# Patient Record
Sex: Male | Born: 1950 | Race: White | Hispanic: No | Marital: Married | Smoking: Current every day smoker
Health system: Southern US, Community
[De-identification: ages and names within clinical notes are randomized; demographics above are authoritative.]

## PROBLEM LIST (undated history)

## (undated) DIAGNOSIS — E785 Hyperlipidemia, unspecified: Secondary | ICD-10-CM

## (undated) DIAGNOSIS — I252 Old myocardial infarction: Secondary | ICD-10-CM

## (undated) DIAGNOSIS — I10 Essential (primary) hypertension: Secondary | ICD-10-CM

## (undated) DIAGNOSIS — I255 Ischemic cardiomyopathy: Secondary | ICD-10-CM

## (undated) DIAGNOSIS — G4730 Sleep apnea, unspecified: Secondary | ICD-10-CM

## (undated) DIAGNOSIS — I219 Acute myocardial infarction, unspecified: Secondary | ICD-10-CM

## (undated) DIAGNOSIS — J449 Chronic obstructive pulmonary disease, unspecified: Secondary | ICD-10-CM

## (undated) DIAGNOSIS — N400 Enlarged prostate without lower urinary tract symptoms: Secondary | ICD-10-CM

## (undated) DIAGNOSIS — I493 Ventricular premature depolarization: Secondary | ICD-10-CM

## (undated) DIAGNOSIS — Z972 Presence of dental prosthetic device (complete) (partial): Secondary | ICD-10-CM

## (undated) HISTORY — DX: Old myocardial infarction: I25.2

## (undated) HISTORY — PX: SHOULDER SURGERY: SHX246

## (undated) HISTORY — DX: Ischemic cardiomyopathy: I25.5

## (undated) HISTORY — DX: Chronic obstructive pulmonary disease, unspecified: J44.9

## (undated) HISTORY — DX: Essential (primary) hypertension: I10

## (undated) HISTORY — PX: CYSTOSCOPY WITH INSERTION OF UROLIFT: SHX6678

## (undated) HISTORY — PX: HERNIA REPAIR: SHX51

## (undated) HISTORY — DX: Benign prostatic hyperplasia without lower urinary tract symptoms: N40.0

## (undated) HISTORY — DX: Hyperlipidemia, unspecified: E78.5

---

## 2006-11-29 DIAGNOSIS — I2510 Atherosclerotic heart disease of native coronary artery without angina pectoris: Secondary | ICD-10-CM

## 2006-11-29 HISTORY — DX: Atherosclerotic heart disease of native coronary artery without angina pectoris: I25.10

## 2007-09-18 ENCOUNTER — Ambulatory Visit: Payer: Self-pay | Admitting: *Deleted

## 2007-09-18 ENCOUNTER — Inpatient Hospital Stay (HOSPITAL_COMMUNITY): Admission: EM | Admit: 2007-09-18 | Discharge: 2007-09-20 | Payer: Self-pay | Admitting: Cardiology

## 2007-09-18 ENCOUNTER — Encounter: Payer: Self-pay | Admitting: Emergency Medicine

## 2007-10-09 ENCOUNTER — Ambulatory Visit: Payer: Self-pay | Admitting: Cardiology

## 2007-10-13 ENCOUNTER — Ambulatory Visit: Payer: Self-pay | Admitting: Cardiology

## 2010-09-23 ENCOUNTER — Emergency Department (HOSPITAL_COMMUNITY): Admission: EM | Admit: 2010-09-23 | Discharge: 2010-09-23 | Payer: Self-pay | Admitting: Emergency Medicine

## 2011-04-13 NOTE — Discharge Summary (Signed)
NAMETEMITOPE, GRIFFING               ACCOUNT NO.:  [id number]   MEDICAL RECORD NO.:  [id number]          PATIENT TYPE:  INP   LOCATION:  2919                         FACILITY:  [facility]   PHYSICIAN:  Everardo Beals. Juanda Chance, MD, FACCDATE OF BIRTH:  March 18, 1951   DATE OF ADMISSION:  09/18/2007  DATE OF DISCHARGE:                         DISCHARGE SUMMARY - REFERRING   ADDENDUM:  Please send copy of the original discharge summary to Dr.  Olena Leatherwood.  Thanks.      Joellyn Rued, PA-C      Bruce R. Juanda Chance, MD, [facility]  Electronically Signed    EW/MEDQ  D:  09/20/2007  T:  09/20/2007  Job:  [id number]

## 2011-04-13 NOTE — H&P (Signed)
NAMEFERD, Scott Castaneda               ACCOUNT NO.:  [id number]   MEDICAL RECORD NO.:  [id number]          PATIENT TYPE:  INP   LOCATION:  2901                         FACILITY:  [facility]   PHYSICIAN:  Audery Amel, MD    DATE OF BIRTH:  Dec 18, 1950   DATE OF ADMISSION:  09/18/2007  DATE OF DISCHARGE:                              HISTORY & PHYSICAL   PRIMARY CARE PHYSICIAN:  Dr. Robynn Pane.   CHIEF COMPLAINT:  Chest pain.   HISTORY OF PRESENT ILLNESS:  The patient is a 60 year old white male  with no significant past medical history, who presented to [facility]  emergency department after the onset of chest pain early this evening.  The patient states that he was experiencing indigestion symptoms, which  began at approximately 2:00 p.m. yesterday.  The patient thought it  might be a viral gastrointestinal illness, as one of his  coworkers  recently had similar symptoms.  His symptoms persisted throughout the  evening hours yesterday.  He does also note that he had some discomfort  in his throat; however denies any chest pain.  On awakening this  morning, his symptoms of indigestion had resolved; however, he continued  to feel very fatigued and did not go to work today.  This evening after  eating dinner, the patient had a presyncopal episode associated with  profound diaphoresis.  The patient was taken to [facility] for  evaluation, and his initial EKG revealed inferior ST elevation.  The  patient was transferred to [facility] and a Code status was activated.  The patient was taken emergently to the catheterization laboratory,  where he was found to have a distal occlusion in his right coronary  artery and a significant amount of thrombus.  The patient remained  hemodynamically stable throughout the case, and underwent percutaneous  coronary intervention with a Promus [id number] stent.  The patient had an  excellent angiographic result, with resolution of his symptoms.  Currently he is chest  pain-free and otherwise without complaints.   REVIEW OF SYSTEMS:  The patient denies any shortness of breath, dyspnea  on exertion, PND, orthopnea or palpitations.  The remainder of his  review of systems are all negative.   PAST MEDICAL HISTORY:  Gastroesophageal reflux disease.   ALLERGIES:  NO KNOWN DRUG ALLERGIES.   CURRENT MEDICATIONS:  The patient takes an over-the-counter H2  antagonist.   SOCIAL HISTORY:  The patient lives in [place] with his wife.  He is  self-employed as a Emergency planning/management officer.  He smokes 2 packs of  cigarettes daily, and has done so for approximately 34 years.  He  endorses occasional alcohol use, however denies any illicit substance.   FAMILY HISTORY:  His father died from coronary artery disease at the age  of 4.  His mother died from lung cancer at the age of 104.  He has a  brother who has hypertension and a sister with no significant medical  problems.   PHYSICAL EXAMINATION:  Blood pressure 129/79, heart rate 72, O2  saturations 95% on room air, weight 74.8 kg.  GENERAL:  Alert  and oriented x3 in no acute stress.  Pleasantly  conversant.  HEENT:  Normocephalic, atraumatic, EOMI, PERRL, nares  patent, oropharynx is clear without erythema or exudate.  NECK:  Supple, full range of motion and no appreciable JVD.  Carotid  upstrokes were equal and symmetric bilaterally.  Lymphadenopathy none.  CARDIOVASCULAR:  Normal S1 and S2, with no audible murmurs, rubs or  gallops.  His PMI is not displaced in the left mid-clavicular line.  Peripheral pulses are 2+ and symmetric bilaterally.  LUNGS: Clear to auscultation bilaterally.  SKIN:  Reveals no rash or lesions.  ABDOMEN:  Soft, nontender, nondistended; positive bowel sounds.  No  hepatosplenomegaly.  GU: Normal male genitalia.  EXTREMITIES: No clubbing, cyanosis or edema.  The catheterization site  in his right groin is clean, dry and intact.  There were  no audible  bruits.  MUSCULOSKELETAL:  No  joint deformity or effusions.  NEUROLOGIC:  Grossly nonfocal.  His strength and sensation are grossly  intact throughout.   CHEST X-RAY:  No acute cardiopulmonary process.   EKG:  The presenting EKG revealed normal sinus rhythm with ST elevation  in the inferior leads and ST depression in V1-V2 -- consistent with an  inferoposterior ST elevation MI.   LABS:  White count 15.9, hematocrit 45.5, platelet count 223.  Sodium  136, potassium 3.7, chloride 106, CO2 25, BUN 7. creatinine 0.9, glucose  122, myoglobin 387, CK-MB greater than 80 by point-of-care markers.  Troponin by point-of-care 28.7.   IMPRESSION:  1. Acute Coronary Syndrome:  Inferoposterior ST elevation myocardial      infarction.  2. Gastroesophageal Reflux Disease.   PLAN:  The patient was taken emergently and underwent a percutaneous  coronary intervention with the 2.5 x 15 Promus stent.  The stent was  post-dilated.  The patient achieved an excellent angiographic result,  with resolution of his symptoms.  The patient was initiated on  bivalirudin for anticoagulation for the case, and we will continue with  this at 0.25 mg/kg to complete his infusion.  We will continue aspirin  325 mg once daily.  The patient was loaded with 600 mg of Plavix in the  field.  We will continue 75 mg of Plavix p.o. daily.  We will check his  fasting lipid profile in the a.m., and start Lupitor 80 mg once daily.   The patient is currently in a junctional rhythm, but asymptomatic.  We  will continue normal saline at 150 mL/hr x 12 hr.  We withhold beta  blocker therapy at this time.  We will continue normal saline at 150  mL/hr x12 hr.  We will monitor the patient in the CCU overnight, and if  he remains chest pain-free and hemodynamically stable will probably  transfer to the Floor in the morning.      Audery Amel, MD  Electronically Signed     SHG/MEDQ  D:  09/18/2007  T:  09/19/2007  Job:  [id number]

## 2011-04-13 NOTE — Cardiovascular Report (Signed)
Scott, HIRSCH               ACCOUNT NO.:  [id number]   MEDICAL RECORD NO.:  [id number]          PATIENT TYPE:  INP   LOCATION:  2901                         FACILITY:  [facility]   PHYSICIAN:  Everardo Beals. Juanda Chance, MD, FACCDATE OF BIRTH:  12/17/1950   DATE OF PROCEDURE:  09/19/2007  DATE OF DISCHARGE:                            CARDIAC CATHETERIZATION   CLINICAL HISTORY:  Mr. Genest is 60 years old, and has had no prior  history of known heart disease.  He is a smoker, and his father had a  heart attack.  Yesterday, he developed some indigestion and epigastric  pain which persisted throughout much of the day and evening.  He felt  better,  today, but this evening about 7 o'clock he had a syncopal  episode at home.  He was brought to [facility] Emergency Room by EMS  where his ECG showed changes of an acute diaphragmatic wall infarction.  He was having minimal pain but code STEMI was called, and his  transferred to [place] by [facility] for evaluation with angiography and for  possible intervention.   PROCEDURE:  The procedure was performed via right femoral artery using  arterial sheath and 6-French preformed coronary catheters.  After  completion of the diagnostic study, I made a decision to proceed with  intervention on the totally occluded distal right coronary.  A JR-4, 6-  [place] guiding catheter with side holes was used.  The patient had been  given 600 mg of Plavix and 4 chewable aspirin in the field, and 3000  units of heparin in the field, and received bivalirudin bolus and  infusion here.   We used a Research scientist (physical sciences) and crossed the lesion without too much  difficulty.  This did not reestablish flow.  We then went in with a 2.25  x 20-mm Maverick and dilated the lesion, and this did establish TIMI 2  flow.  We decided to go ahead and inflate the balloon, and we went up to  6 atmospheres for 20 seconds and this improved the flow.  We then went  down with a fetch aspiration catheter and  performed 3 runs.  We obtained  minimal thrombus.   We then dilated the lesion once more with a 2.25 x 20 mm balloon with  one inflation up to 10 atmospheres for 20 seconds.  We then deployed a  2.5 x 15 mm Promus stent.  We deployed this extending from the distal  right coronary into the posterior branch, and crossing a posterolateral  branch.  We deployed this with one inflation of 14 atmospheres for 20  seconds.  We postdilated the distal part of the stent with a 2.75 x 12  mm Quantum Maverick up to 16 atmospheres, and we postdilated the  proximal edge of the stent with a 3.25 x 8 mm Quantum Maverick up to 16  atmospheres.  Final diagnostic study was then performed through the  guiding catheter.  The right femoral artery was closed  with Angio-Seal  at the end of the procedure.   RESULTS:  The left ventricular pressure was 110/17, and the  aortic  pressure was 110/64 with a mean of 84.   LEFT MAIN CORONARY ARTERY:  The left main coronary artery was free of  significant disease.   LEFT ANTERIOR DESCENDING ARTERY:  The left anterior descending artery  gave rise to a large septal perforator and three diagonal branches.  The  LAD terminated before the apex.  There was 40% segmental narrowing in  the proximal vessel.   CIRCUMFLEX ARTERY:  The circumflex artery was a moderate-sized vessel  that gave rise to 2 marginal branches, 2 atrial branches, and a small-  and-large posterolateral branch.  There was 30% proximal, 60% mid, and  40% distal stenosis in the circumflex artery.   RIGHT CORONARY ARTERY:  The right coronary artery was a moderately large  vessel that gave rise to two right ventricular branches and then was  completely occluded before the posterior descending branch.  The distal  right coronary artery filled faintly from the left coronary injection.   LEFT VENTRICULOGRAM:  The left ventriculogram performed in the RAO  projection showed akinesis of the inferior wall out to  the tip of the  apex.  The anterolateral wall moved well.  The estimated ejection  fraction was 45-50%.   Following stenting of the lesion to the distal right coronary artery the  stenosis went from 100% to 0%, and the flow improved from TIMI 0 to TIMI  3 flow with __________  blush grade 3.   CONCLUSION:  1. Acute diaphragmatic wall myocardial infarction with total occlusion      of the distal right coronary artery, 60% mid and 40% distal      stenosis in the circumflex artery, 40% narrowing of the proximal      LAD, and inferior wall akinesis with an estimated ejection fraction      of 45%-50%  2. Successful reperfusion aspiration thrombectomy and stenting of the      lesion in the distal right coronary with improvement in the      sentinel narrowing from 100% to 0% and improvement in the flow from      TIMI 0 to TIMI 3 flow.   DISPOSITION:  The patient was returned to his room for further  observation.  Will target discharge on day #2 if patient remains stable.      Bruce Elvera Lennox Juanda Chance, MD, [facility]  Electronically Signed     BRB/MEDQ  D:  09/18/2007  T:  09/19/2007  Job:  [id number]   cc:   Lia Hopping  Cardiopulmonary Lab

## 2011-04-13 NOTE — Discharge Summary (Signed)
NAMENICKY, KRAS               ACCOUNT NO.:  [id number]   MEDICAL RECORD NO.:  [id number]          PATIENT TYPE:  INP   LOCATION:  2919                         FACILITY:  [facility]   PHYSICIAN:  Everardo Beals. Juanda Chance, MD, FACCDATE OF BIRTH:  1951-10-19   DATE OF ADMISSION:  09/06/2007  DATE OF DISCHARGE:                         DISCHARGE SUMMARY - REFERRING   DISCHARGE DIAGNOSES:  1. Acute inferior myocardial infarction, presented with abdominal      discomfort and syncope.  2. Coronary artery disease status post stenting of the distal RCA.  3. Hyperlipidemia.  4. Tobacco use.  5. Initially sinus bradycardia.  6. Elevated liver function tests.   PROCEDURES PERFORMED:  Cardiac catheterization on September 18, 2007 by  Dr. Juanda Chance with primus stenting of the distal RCA.   BRIEF HISTORY:  Mr. Moone is a 60 year old white male who was moving  yesterday and had a sudden onset of abdominal discomfort associated with  shortness of breath, diaphoresis.  The discomfort lasted at least 24  hours, but on the day of admission, he had an episode of syncope at  rest.  Bystanders noted cyanotic lips.  He did not have any seizure  activity, thus his presentation.   PAST MEDICAL HISTORY:  1. No known drug allergies.  2. GERD and over-the-counter medication.  3. Tobacco use.   LABORATORY:  Chest x-ray on the October 20, showed minimal right basilar  atelectasis.  H&H 15.6 and 45.5, normal indices, platelets 223, WBCs  15.9.  prior to discharge on October 21 H&H was 14.7 and 41.8, normal  indices, platelets 200, WBC 13.0.  Admission sodium was 136, potassium  3.7, BUN 7, creatinine 0.9, glucose 122.  AST was 242 and ALT at 55.  On  October 21, BUN and creatinine were 6 and 0.92, potassium 3.9, glucose  103.  CK-MB at [facility] was 23, 96 and 277.6 with a relative index  11.6 and troponin 71.99.  fasting lipids showed a total cholesterol 181,  triglycerides 115, HDL 31, LDL 127.  Alcohol less than  five.  Echocardiogram was performed yesterday and is pending at the time of  this dictation.  EKGs showed normal sinus rhythm, left axis deviation,  LVH, ST elevation inferolaterally on admission.  This admission shows  evolving inferior myocardial infarction with Q-waves inferiorly and T-  wave inversion.   HOSPITAL COURSE:  The patient was transferred to [facility]  with acute MI.  On September 18, 2007, Dr. Juanda Chance performed cardiac  catheterization.  This revealed a 40% LAD, 30% proximal circ, 60% mid  circ, 40% distal circ.  Irregularities in the proximal mid RCA 30%  distal RCA, 100% RCA just prior to the PDA and PL.  Dr. Juanda Chance performed  primus stenting reducing the lesion from 100% to 0%.  He was admitted to  CCU post procedure.  Catheterization site was intact.  The night he did  rule in for myocardial infarction, he did not appear to have any  evidence of heart failure.  ACE inhibitor was not added.  However, given  his bradycardia, beta blocker was held.  Telemetry  did show some  nonsustained ventricular tachycardia.  Research placed him in the adept  DES study.  Tobacco cessation consult was performed.  Cardiac rehab also  assisted with ambulation and education.  By October 22, the patient was  doing much better.  Dr. Juanda Chance felt that the patient could be discharged  home.  Dr. Juanda Chance suggested follow up in [facility] with Dr. Andee Lineman as well as  cardiac rehab.  However, there was concern that he may not be able to  afford rehab as he has no health insurance.  Case management was  assisting with Plavix forms prior to discharge.  Given his low blood  pressure it was decided no ACE inhibitor.  However, slight dose of beta  blocker will be begun.   DISPOSITION:  The patient is discharged home.   DISCHARGE INSTRUCTIONS:  1. Activities are restricted for 2 weeks in regards to lifting,      driving, sexual activity or heavy exertion.  He was told not to go      back to work  until seen by the physician.  2 . Wound care as per supplemental sheet.  1. New prescriptions include aspirin 325 mg daily, Plavix 75 mg daily,      simvastatin 40 mg q.h.s., nitroglycerin 0.4 as needed, Toprol XL 25      mg as needed.  2. He is advised no smoking or tobacco products.  3. He needs to obtain a primary care physician.  4. He will need blood work in approximately 6-8 weeks in regards to      FLP and LFTs.  Consideration may be given to checking these sooner      as his AST and ALT were elevated during this admission.  He was      asked to bring all medications to all appointments.  He will follow      up with Dr. Andee Lineman in [place] on October 09, 2007 at 12:15.  He was      advised to bring all medications to all appointments.      Joellyn Rued, PA-C      Bruce R. Juanda Chance, MD, [facility]  Electronically Signed    EW/MEDQ  D:  09/20/2007  T:  09/20/2007  Job:  [id number]   cc:   Learta Codding, MD,FACC

## 2011-04-13 NOTE — Assessment & Plan Note (Signed)
[facility]                          [facility] OFFICE NOTE   Castaneda, Scott Castaneda                        MRN:          [id number]  DATE:10/09/2007                            DOB:          [birth year]    REFERRING PHYSICIAN:  Lia Hopping   HISTORY OF PRESENT ILLNESS:  The patient is a 60 year old male with no  prior history of coronary artery disease but heavy tobacco use. The  patient used to smoke up to two packs a day. He has now cut down to two  cigarettes a day. The patient on September 19, 2007, was brought to [facility] with an acute inferior wall myocardial infarction.  Interestingly, however, the patient symptoms had occurred several days  prior to admission. Also, the patient subsequently underwent cardiac  catheterization and was found to have an occluded right coronary artery  and was stented with a PROMUS stent. Ejection fraction was 45-50%. There  was inferior akinesis. EKG today in the office denotes large Q-waves and  inferior walls and inverted T-waves consistent with late presentation of  inferior wall myocardial infarction. The patient however is feeling  better. He has no chest pain, shortness of breath, orthopnea, PND. He  has cut back on his tobacco use as outlined above. He reports no  palpitations or syncope.   The patient is compliant with his medical regimen.   CURRENT MEDICATIONS:  1. Plavix 75 mg p.o. daily.  2. Simvastatin 40 mg p.o. nightly.  3. Metoprolol 25 mg p.o. daily.  4. Aspirin 325 mg p.o. daily.   PHYSICAL EXAMINATION:  VITAL SIGNS: Blood pressure is 130/56, heart rate  57 beats per minute, weight 165 pounds.  NECK: Normal carotid upstroke. No carotid bruits.  LUNGS:  Clear breath sounds bilaterally.  HEART: Regular rate and rhythm. Normal S1, S2. No murmur, rubs or  gallops.  ABDOMEN: Soft and nontender. No rebound or guarding. Good bowel sounds.  EXTREMITIES: No cyanosis, clubbing or  edema.   PROBLEM LIST:  1. Status post inferior wall myocardial infarction with late      presentation.  2. Q-waves inferior leads and persistent T-wave inversion.  3. Ejection fraction of 45-50%.  4. Dyslipidemia with low HDL and LDL of 127.  5. Elevated liver function test.  6. Tobacco use.   PLAN:  1. The patient will have an echocardiogram study to reassess his      ejection fraction.  2. The patient will have a GXT done in 4-6 weeks to assess his      exercise tolerance.  3. Liver function tests and lipid panel will be drawn at the time of      his echocardiographic study.   I have advised the patient to continue on his current medical therapy  with close monitoring his cholesterol panel. The patient will see [place]  back in six months.     Learta Codding, MD,FACC  Electronically Signed    GED/MedQ  DD: 10/09/2007  DT: 10/09/2007  Job #: [id number]   cc:   Lia Hopping

## 2011-09-08 LAB — CBC
HCT: 41.8
HCT: 45.5
Hemoglobin: 14.7
Hemoglobin: 15.6
MCHC: 34.4
MCV: 95.1
Platelets: 223
RBC: 4.45
RBC: 4.79
RDW: 12.6
WBC: 13 — ABNORMAL HIGH
WBC: 15.9 — ABNORMAL HIGH

## 2011-09-08 LAB — POCT CARDIAC MARKERS
CKMB, poc: >80
Myoglobin, poc: 387
Operator id: 208401
Troponin i, poc: 28.7

## 2011-09-08 LAB — DIFFERENTIAL
Basophils Absolute: 0
Basophils Relative: 0
Eosinophils Absolute: 0.1
Eosinophils Relative: 0
Lymphocytes Relative: 11 — ABNORMAL LOW
Lymphs Abs: 1.7
Monocytes Absolute: 1.4 — ABNORMAL HIGH
Monocytes Relative: 9
Neutro Abs: 12.7 — ABNORMAL HIGH
Neutrophils Relative %: 80 — ABNORMAL HIGH

## 2011-09-08 LAB — COMPREHENSIVE METABOLIC PANEL WITH GFR
ALT: 55 — ABNORMAL HIGH
Albumin: 3.6
Alkaline Phosphatase: 71
Calcium: 9
Glucose, Bld: 122 — ABNORMAL HIGH
Potassium: 3.7
Sodium: 136
Total Protein: 6

## 2011-09-08 LAB — COMPREHENSIVE METABOLIC PANEL
AST: 242 — ABNORMAL HIGH
BUN: 7
CO2: 25
Chloride: 106
Creatinine, Ser: 0.9
GFR calc Af Amer: >60
GFR calc non Af Amer: >60
Total Bilirubin: 0.6

## 2011-09-08 LAB — BASIC METABOLIC PANEL
BUN: 6
CO2: 23
Chloride: 103
GFR calc non Af Amer: >60
Glucose, Bld: 103 — ABNORMAL HIGH
Potassium: 3.9

## 2011-09-08 LAB — LIPID PANEL
HDL: 31 — ABNORMAL LOW
LDL Cholesterol: 127 — ABNORMAL HIGH
Triglycerides: 115
VLDL: 23

## 2011-09-08 LAB — ETHANOL: Alcohol, Ethyl (B): <5

## 2011-09-08 LAB — CARDIAC PANEL(CRET KIN+CKTOT+MB+TROPI): Relative Index: 11.6 — ABNORMAL HIGH

## 2019-10-22 ENCOUNTER — Telehealth: Payer: Self-pay | Admitting: Acute Care

## 2019-10-22 DIAGNOSIS — Z122 Encounter for screening for malignant neoplasm of respiratory organs: Secondary | ICD-10-CM

## 2019-10-22 DIAGNOSIS — Z87891 Personal history of nicotine dependence: Secondary | ICD-10-CM

## 2019-10-22 DIAGNOSIS — F17210 Nicotine dependence, cigarettes, uncomplicated: Secondary | ICD-10-CM

## 2019-10-23 NOTE — Telephone Encounter (Signed)
Pt returning missed call and would like a call back 

## 2019-10-23 NOTE — Telephone Encounter (Signed)
LMTC x 1  

## 2019-10-29 NOTE — Telephone Encounter (Signed)
Spoke with pt and scheduled SDMV 11/12/19 3:30 CT ordered Nothing further needed

## 2019-11-12 ENCOUNTER — Other Ambulatory Visit: Payer: Self-pay

## 2019-11-12 ENCOUNTER — Ambulatory Visit (INDEPENDENT_AMBULATORY_CARE_PROVIDER_SITE_OTHER): Payer: Commercial Managed Care - PPO | Admitting: Acute Care

## 2019-11-12 ENCOUNTER — Ambulatory Visit
Admission: RE | Admit: 2019-11-12 | Discharge: 2019-11-12 | Disposition: A | Discharge status: Home / Self Care | Payer: Commercial Managed Care - PPO | Source: Ambulatory Visit | Attending: Acute Care | Admitting: Acute Care

## 2019-11-12 ENCOUNTER — Encounter: Payer: Self-pay | Admitting: Acute Care

## 2019-11-12 VITALS — BP 102/62 | HR 62 | Temp 98.2°F | Ht 66.0 in | Wt 169.0 lb

## 2019-11-12 DIAGNOSIS — F17210 Nicotine dependence, cigarettes, uncomplicated: Secondary | ICD-10-CM

## 2019-11-12 DIAGNOSIS — Z87891 Personal history of nicotine dependence: Secondary | ICD-10-CM

## 2019-11-12 DIAGNOSIS — Z122 Encounter for screening for malignant neoplasm of respiratory organs: Secondary | ICD-10-CM

## 2019-11-12 NOTE — Patient Instructions (Signed)
Thank you for participating in the [place] Lung Cancer Screening Program. It was our pleasure to meet you today. We will call you with the results of your scan within the next few days. Your scan will be assigned a Lung RADS category score by the physicians reading the scans.  This Lung RADS score determines follow up scanning.  See below for description of categories, and follow up screening recommendations. We will be in touch to schedule your follow up screening annually or based on recommendations of our providers. We will fax a copy of your scan results to your Primary Care Physician, or the physician who referred you to the program, to ensure they have the results. Please call the office if you have any questions or concerns regarding your scanning experience or results.  Our office number is [phone]. Please speak with Denise Phelps, RN. She is our Lung Cancer Screening RN. If she is unavailable when you call, please have the office staff send her a message. She will return your call at her earliest convenience. Remember, if your scan is normal, we will scan you annually as long as you continue to meet the criteria for the program. (Age 55-77, Current smoker or smoker who has quit within the last 15 years). If you are a smoker, remember, quitting is the single most powerful action that you can take to decrease your risk of lung cancer and other pulmonary, breathing related problems. We know quitting is hard, and we are here to help.  Please let us know if there is anything we can do to help you meet your goal of quitting. If you are a former smoker, congratulations. We are proud of you! Remain smoke free! Remember you can refer friends or family members through the number above.  We will screen them to make sure they meet criteria for the program. Thank you for helping us take better care of you by participating in Lung Screening.  Lung RADS Categories:  Lung RADS 1: no nodules  or definitely non-concerning nodules.  Recommendation is for a repeat annual scan in 12 months.  Lung RADS 2:  nodules that are non-concerning in appearance and behavior with a very low likelihood of becoming an active cancer. Recommendation is for a repeat annual scan in 12 months.  Lung RADS 3: nodules that are probably non-concerning , includes nodules with a low likelihood of becoming an active cancer.  Recommendation is for a 6-month repeat screening scan. Often noted after an upper respiratory illness. We will be in touch to make sure you have no questions, and to schedule your 6-month scan.  Lung RADS 4 A: nodules with concerning findings, recommendation is most often for a follow up scan in 3 months or additional testing based on our provider's assessment of the scan. We will be in touch to make sure you have no questions and to schedule the recommended 3 month follow up scan.  Lung RADS 4 B:  indicates findings that are concerning. We will be in touch with you to schedule additional diagnostic testing based on our provider's  assessment of the scan.   

## 2019-11-12 NOTE — Progress Notes (Signed)
Shared Decision Making Visit Lung Cancer Screening Program [phone])   Eligibility:  Age 68 y.o.  Pack Years Smoking History Calculation 46 pack year smoking history (# packs/per year x # years smoked)  Recent History of coughing up blood  no  Unexplained weight loss? no ( >Than 15 pounds within the last 6 months )  Prior History Lung / other cancer no (Diagnosis within the last 5 years already requiring surveillance chest CT Scans).  Smoking Status Current Smoker  Former Smokers: Years since quit: NA  Quit Date: 11/12/2019  Visit Components:  Discussion included one or more decision making aids. yes  Discussion included risk/benefits of screening. yes  Discussion included potential follow up diagnostic testing for abnormal scans. yes  Discussion included meaning and risk of over diagnosis. yes  Discussion included meaning and risk of False Positives. yes  Discussion included meaning of total radiation exposure. yes  Counseling Included:  Importance of adherence to annual lung cancer LDCT screening. yes  Impact of comorbidities on ability to participate in the program. yes  Ability and willingness to under diagnostic treatment. yes  Smoking Cessation Counseling:  Current Smokers:   Discussed importance of smoking cessation. yes  Information about tobacco cessation classes and interventions provided to patient. yes  Patient provided with "ticket" for LDCT Scan. yes  Symptomatic Patient. no  Counseling  Diagnosis Code: Tobacco Use Z72.0  Asymptomatic Patient yes  Counseling (Intermediate counseling: > three minutes counseling) L9379  Former Smokers:   Discussed the importance of maintaining cigarette abstinence. yes  Diagnosis Code: Personal History of Nicotine Dependence. K24.097  Information about tobacco cessation classes and interventions provided to patient. Yes  Patient provided with "ticket" for LDCT Scan. yes  Written Order for Lung Cancer  Screening with LDCT placed in Epic. Yes (CT Chest Lung Cancer Screening Low Dose W/O CM) [id number] Z12.2-Screening of respiratory organs Z87.891-Personal history of nicotine dependence  BP 102/62 (BP Location: Left Arm, Cuff Size: Normal)   Pulse 62   Temp 98.2 F (36.8 C) (Oral)   Ht 5\' 6"  (1.676 m)   Wt 169 lb (76.7 kg)   SpO2 96%   BMI 27.28 kg/m    I have spent 25 minutes of face to face time with Scott Castaneda discussing the risks and benefits of lung cancer screening. We viewed a power point together that explained in detail the above noted topics. We paused at intervals to allow for questions to be asked and answered to ensure understanding.We discussed that the single most powerful action that he can take to decrease his risk of developing lung cancer is to quit smoking. We discussed whether or not he is ready to commit to setting a quit date. We discussed options for tools to aid in quitting smoking including nicotine replacement therapy, non-nicotine medications, support groups, Quit Smart classes, and behavior modification. We discussed that often times setting smaller, more achievable goals, such as eliminating 1 cigarette a day for a week and then 2 cigarettes a day for a week can be helpful in slowly decreasing the number of cigarettes smoked. This allows for a sense of accomplishment as well as providing a clinical benefit. I gave her the " Be Stronger Than Your Excuses" card with contact information for community resources, classes, free nicotine replacement therapy, and access to mobile apps, text messaging, and on-line smoking cessation help. I have also given him  my card and contact information in the event he needs to contact me. We discussed the time  and location of the scan, and that either Scott Miyamoto RN or I will call with the results within 24-48 hours of receiving them. I have offered her  a copy of the power point we viewed  as a resource in the event they need reinforcement  of the concepts we discussed today in the office. The patient verbalized understanding of all of  the above and had no further questions upon leaving the office. They have my contact information in the event they have any further questions.  I spent 4 minutes counseling on smoking cessation and the health risks of continued tobacco abuse.  I explained to the patient that there has been a high incidence of coronary artery disease noted on these exams. I explained that this is a non-gated exam therefore degree or severity cannot be determined. This patient is on statin therapy. I have asked the patient to follow-up with their PCP regarding any incidental finding of coronary artery disease and management with diet or medication as their PCP  feels is clinically indicated. The patient verbalized understanding of the above and had no further questions upon completion of the visit.      Bevelyn Ngo, NP 11/12/2019 4:02 PM

## 2019-11-15 ENCOUNTER — Other Ambulatory Visit: Payer: Self-pay | Admitting: *Deleted

## 2019-11-15 DIAGNOSIS — Z87891 Personal history of nicotine dependence: Secondary | ICD-10-CM

## 2019-11-15 DIAGNOSIS — F17210 Nicotine dependence, cigarettes, uncomplicated: Secondary | ICD-10-CM

## 2020-11-12 ENCOUNTER — Other Ambulatory Visit: Payer: Self-pay

## 2020-11-12 ENCOUNTER — Ambulatory Visit (HOSPITAL_COMMUNITY)
Admission: RE | Admit: 2020-11-12 | Discharge: 2020-11-12 | Disposition: A | Discharge status: Home / Self Care | Payer: Commercial Managed Care - PPO | Source: Ambulatory Visit | Attending: Acute Care | Admitting: Acute Care

## 2020-11-12 DIAGNOSIS — Z87891 Personal history of nicotine dependence: Secondary | ICD-10-CM | POA: Diagnosis present

## 2020-11-12 DIAGNOSIS — F17210 Nicotine dependence, cigarettes, uncomplicated: Secondary | ICD-10-CM | POA: Diagnosis present

## 2020-11-24 ENCOUNTER — Telehealth: Payer: Self-pay | Admitting: Acute Care

## 2020-11-24 DIAGNOSIS — Z87891 Personal history of nicotine dependence: Secondary | ICD-10-CM

## 2020-11-24 DIAGNOSIS — F17210 Nicotine dependence, cigarettes, uncomplicated: Secondary | ICD-10-CM

## 2020-11-24 NOTE — Telephone Encounter (Signed)
Pt informed of CT results per Sarah Groce, NP.  PT verbalized understanding.  Copy sent to PCP.  Order placed for 1 yr f/u CT.  

## 2020-11-27 NOTE — Progress Notes (Signed)
Please call patient and let them  know their  low dose Ct was read as a Lung RADS 2: nodules that are benign in appearance and behavior with a very low likelihood of becoming a clinically active cancer due to size or lack of growth. Recommendation per radiology is for a repeat LDCT in 12 months.Please let them  know we will order and schedule their  annual screening scan for 10/2021. Please let them  know there was notation of CAD on their  scan.  Please remind the patient  that this is a non-gated exam therefore degree or severity of disease  cannot be determined. Please have them  follow up with their PCP regarding potential risk factor modification, dietary therapy or pharmacologic therapy if clinically indicated. Pt.  is  currently on statin therapy. Please place order for annual  screening scan for  10/2021 and fax results to PCP. Thanks so much.  Please let him know there was notation of CAD on his scan. He is followed by cards. Thanks so much

## 2021-02-24 ENCOUNTER — Ambulatory Visit: Payer: Commercial Managed Care - PPO | Admitting: Cardiology

## 2021-04-03 ENCOUNTER — Encounter: Payer: Self-pay | Admitting: *Deleted

## 2021-04-06 ENCOUNTER — Encounter: Payer: Self-pay | Admitting: *Deleted

## 2021-04-06 ENCOUNTER — Telehealth: Payer: Self-pay | Admitting: Cardiology

## 2021-04-06 ENCOUNTER — Ambulatory Visit (INDEPENDENT_AMBULATORY_CARE_PROVIDER_SITE_OTHER): Payer: Commercial Managed Care - PPO | Admitting: Cardiology

## 2021-04-06 ENCOUNTER — Encounter: Payer: Self-pay | Admitting: Cardiology

## 2021-04-06 VITALS — BP 126/78 | HR 56 | Ht 66.0 in | Wt 168.6 lb

## 2021-04-06 DIAGNOSIS — I2510 Atherosclerotic heart disease of native coronary artery without angina pectoris: Secondary | ICD-10-CM | POA: Diagnosis not present

## 2021-04-06 DIAGNOSIS — I252 Old myocardial infarction: Secondary | ICD-10-CM

## 2021-04-06 DIAGNOSIS — R0602 Shortness of breath: Secondary | ICD-10-CM | POA: Diagnosis not present

## 2021-04-06 DIAGNOSIS — E782 Mixed hyperlipidemia: Secondary | ICD-10-CM

## 2021-04-06 NOTE — Patient Instructions (Signed)
Your physician recommends that you schedule a follow-up appointment in: 3 MONTHS WITH EXTENDER  Your physician recommends that you continue on your current medications as directed. Please refer to the Current Medication list given to you today.  Your physician has requested that you have en exercise stress myoview. For further information please visit [email or web address] Please follow instruction sheet, as given.  Thank you for choosing [facility]!!

## 2021-04-06 NOTE — Progress Notes (Signed)
Clinical Summary Scott Castaneda is a 70 y.o.male seen today as a new consult, referred by Dr Olena Leatherwood for the following meidcla problems.    1. CAD - 08/2007 history of inferior STEMI, found to have an occluded RCA and received a DES - at that time LVEF 45-50% - most recently had been followed by cardiology at [facility], last visit roughly 2012   - no recent chest pain. Some recent SOB/DOE with high levels of exertion he reports is new.  - no recent edema - compliant with meds. He is no longer taking plavix, just asprin    2. Hyperlipidemia - muscle aches on simvastatin, changed to crestor with resolution.     PMH 1. CAD 2. Hyperlipidemia    No Known Allergies   Current Outpatient Medications  Medication Sig Dispense Refill  . alfuzosin (UROXATRAL) 10 MG 24 hr tablet Take 10 mg by mouth daily with breakfast.    . budesonide-formoterol (SYMBICORT) 160-4.5 MCG/ACT inhaler Inhale 2 puffs into the lungs 2 (two) times daily.    Marland Kitchen Clopidogrel Bisulfate (PLAVIX PO) Take by mouth.    . enalapril (VASOTEC) 5 MG tablet Take 5 mg by mouth daily.    Marland Kitchen gabapentin (NEURONTIN) 300 MG capsule Take 300 mg by mouth at bedtime.    . metoprolol succinate (TOPROL-XL) 25 MG 24 hr tablet Take 25 mg by mouth daily.    . rosuvastatin (CRESTOR) 40 MG tablet Take 40 mg by mouth daily.     No current facility-administered medications for this visit.     No past surgical history on file.   No Known Allergies    No family history on file.   Social History Scott Castaneda reports that he has been smoking cigarettes. He has a 46.00 pack-year smoking history. His smokeless tobacco use includes snuff. Scott Castaneda has no history on file for alcohol use.   Review of Systems CONSTITUTIONAL: No weight loss, fever, chills, weakness or fatigue.  HEENT: Eyes: No visual loss, blurred vision, double vision or yellow sclerae.No hearing loss, sneezing, congestion, runny nose or sore throat.  SKIN: No rash  or itching.  CARDIOVASCULAR: per hpi RESPIRATORY: No shortness of breath, cough or sputum.  GASTROINTESTINAL: No anorexia, nausea, vomiting or diarrhea. No abdominal pain or blood.  GENITOURINARY: No burning on urination, no polyuria NEUROLOGICAL: No headache, dizziness, syncope, paralysis, ataxia, numbness or tingling in the extremities. No change in bowel or bladder control.  MUSCULOSKELETAL: No muscle, back pain, joint pain or stiffness.  LYMPHATICS: No enlarged nodes. No history of splenectomy.  PSYCHIATRIC: No history of depression or anxiety.  ENDOCRINOLOGIC: No reports of sweating, cold or heat intolerance. No polyuria or polydipsia.  Marland Kitchen   Physical Examination Today's Vitals   04/06/21 1452  BP: 126/78  Pulse: (!) 56  SpO2: 97%  Weight: 168 lb 9.6 oz (76.5 kg)  Height: 5\' 6"  (1.676 m)   Body mass index is 27.21 kg/m.  Gen: resting comfortably, no acute distress HEENT: no scleral icterus, pupils equal round and reactive, no palptable cervical adenopathy,  CV: RRR, no m/r/g ,no jvd Resp: Clear to auscultation bilaterally GI: abdomen is soft, non-tender, non-distended, normal bowel sounds, no hepatosplenomegaly MSK: extremities are warm, no edema.  Skin: warm, no rash Neuro:  no focal deficits Psych: appropriate affect   Diagnostic Studies 08/2007 cath CONCLUSION:  1. Acute diaphragmatic wall myocardial infarction with total occlusion      of the distal right coronary artery, 60% mid and 40%  distal      stenosis in the circumflex artery, 40% narrowing of the proximal      LAD, and inferior wall akinesis with an estimated ejection fraction      of 45%-50%  2. Successful reperfusion aspiration thrombectomy and stenting of the      lesion in the distal right coronary with improvement in the      sentinel narrowing from 100% to 0% and improvement in the flow from      TIMI 0 to TIMI 3 flow.  12/2020 CTA LE IMPRESSION: VASCULAR  Mild aortoiliac atherosclerotic  changes without evidence of flow-limiting stenosis ( Aortic Atherosclerosis (ICD10-I70.0)). Patent bilateral lower extremity arteries with three-vessel runoff to the level of the ankle.  NON-VASCULAR  Colonic diverticulosis without evidence of diverticulitis. No acute intra-abdominal abnormality.    Assessment and Plan  1. CAD - some recent DOE with higher levels of activity that he reports is new - we will obtain an exercise nuclear stress for further evaluation. MI in 2008 without recent ischemic evaluation. - EKG today shows SR, isolated PVC, inferior Qwaves, no acute ischemic changes.   2. Hyperlipidemia - request labs from pcp, continue statin   Request old records from [place] including stress test, echo, carotid US, labs   Antoine Poche, M.D.

## 2021-04-06 NOTE — Telephone Encounter (Signed)
Checking percert on the following patient for testing scheduled at [facility].   EXERCISE NUC STRESS TEST   04/09/2021

## 2021-04-09 ENCOUNTER — Encounter (HOSPITAL_COMMUNITY): Payer: Commercial Managed Care - PPO

## 2021-04-10 ENCOUNTER — Encounter: Payer: Self-pay | Admitting: Cardiology

## 2021-04-17 ENCOUNTER — Ambulatory Visit (HOSPITAL_COMMUNITY)
Admission: RE | Admit: 2021-04-17 | Discharge: 2021-04-17 | Disposition: A | Discharge status: Home / Self Care | Payer: Commercial Managed Care - PPO | Source: Ambulatory Visit | Attending: Cardiology | Admitting: Cardiology

## 2021-04-17 ENCOUNTER — Encounter (HOSPITAL_COMMUNITY)
Admission: RE | Admit: 2021-04-17 | Discharge: 2021-04-17 | Disposition: A | Discharge status: Home / Self Care | Payer: Commercial Managed Care - PPO | Source: Ambulatory Visit | Attending: Cardiology | Admitting: Cardiology

## 2021-04-17 ENCOUNTER — Encounter (HOSPITAL_COMMUNITY): Payer: Self-pay

## 2021-04-17 DIAGNOSIS — R0602 Shortness of breath: Secondary | ICD-10-CM | POA: Diagnosis not present

## 2021-04-17 LAB — NM MYOCAR MULTI W/SPECT W/WALL MOTION / EF
Estimated workload: 10.1 METS
Exercise duration (min): 7 min
Exercise duration (sec): 0 s
LV dias vol: 114 mL (ref 62–150)
LV sys vol: 67 mL
MPHR: 152 {beats}/min
Peak HR: 150 {beats}/min
Percent HR: 98 %
RATE: 0.36
RPE: 17
Rest HR: 58 {beats}/min
SDS: 3
SRS: 15
SSS: 18
TID: 1.08

## 2021-04-17 MED ORDER — TECHNETIUM TC 99M TETROFOSMIN IV KIT
10.0000 | PACK | Freq: Once | INTRAVENOUS | Status: AC | PRN
  Administered 2021-04-17: 10.8 via INTRAVENOUS

## 2021-04-17 MED ORDER — REGADENOSON 0.4 MG/5ML IV SOLN
INTRAVENOUS | Status: AC
  Filled 2021-04-17: qty 5

## 2021-04-17 MED ORDER — SODIUM CHLORIDE FLUSH 0.9 % IV SOLN
INTRAVENOUS | Status: AC
  Administered 2021-04-17: 10 mL via INTRAVENOUS
  Filled 2021-04-17: qty 10

## 2021-04-17 MED ORDER — TECHNETIUM TC 99M TETROFOSMIN IV KIT
30.0000 | PACK | Freq: Once | INTRAVENOUS | Status: AC | PRN
  Administered 2021-04-17: 31 via INTRAVENOUS

## 2021-04-24 ENCOUNTER — Telehealth: Payer: Self-pay | Admitting: Cardiology

## 2021-04-24 DIAGNOSIS — R0602 Shortness of breath: Secondary | ICD-10-CM

## 2021-04-24 NOTE — Telephone Encounter (Signed)
Calling for stress test results  

## 2021-04-28 NOTE — Telephone Encounter (Signed)
Pt voiced understanding and agreeable to echo - orders placed and will forward to schedulers  

## 2021-04-28 NOTE — Telephone Encounter (Signed)
Just resulted to Scott  J Myer Bohlman MD 

## 2021-04-28 NOTE — Telephone Encounter (Signed)
Stress test overall looks good. We do see signs of old damage from his heart attack, but no evidence of any new blockages. The test suggested the pumping function of his heart may be mildly decreased, a better test to look more closely would be an echo, can we order for sob    Dominga Ferry MD

## 2021-05-14 ENCOUNTER — Other Ambulatory Visit: Payer: Self-pay

## 2021-05-14 ENCOUNTER — Ambulatory Visit (HOSPITAL_COMMUNITY)
Admission: RE | Admit: 2021-05-14 | Discharge: 2021-05-14 | Disposition: A | Discharge status: Home / Self Care | Payer: Commercial Managed Care - PPO | Source: Ambulatory Visit | Attending: Cardiology | Admitting: Cardiology

## 2021-05-14 DIAGNOSIS — R0602 Shortness of breath: Secondary | ICD-10-CM | POA: Diagnosis not present

## 2021-05-14 LAB — ECHOCARDIOGRAM COMPLETE
AR max vel: 2.29 cm2
AV Area VTI: 2.38 cm2
AV Area mean vel: 1.99 cm2
AV Mean grad: 4.7 mmHg
AV Peak grad: 8.6 mmHg
Ao pk vel: 1.47 m/s
Area-P 1/2: 3.53 cm2
S' Lateral: 3.5 cm

## 2021-05-14 NOTE — Progress Notes (Signed)
*  PRELIMINARY RESULTS* Echocardiogram 2D Echocardiogram has been performed.  Scott Castaneda 05/14/2021, 9:14 AM

## 2021-05-21 ENCOUNTER — Telehealth: Payer: Self-pay

## 2021-05-21 NOTE — Telephone Encounter (Signed)
-----   Message from Antoine Poche, MD sent at 05/19/2021 11:03 AM EDT ----- Echo shows normal heart function. Overall his echo and stress test are reassuring heart is in good shape. Should f/u with pcp to consider noncardiac causes of his symptoms, keep current f/u with [place]   Dominga Ferry MD

## 2021-05-21 NOTE — Telephone Encounter (Signed)
Pt notified and voiced understanding. Pt had no questions or concerns at this time. Copy of results sent to PCP

## 2021-07-07 ENCOUNTER — Encounter: Payer: Self-pay | Admitting: Family Medicine

## 2021-07-07 ENCOUNTER — Other Ambulatory Visit: Payer: Self-pay

## 2021-07-07 ENCOUNTER — Ambulatory Visit (INDEPENDENT_AMBULATORY_CARE_PROVIDER_SITE_OTHER): Payer: Commercial Managed Care - PPO | Admitting: Family Medicine

## 2021-07-07 VITALS — BP 110/70 | HR 54 | Ht 66.0 in | Wt 160.2 lb

## 2021-07-07 DIAGNOSIS — Z720 Tobacco use: Secondary | ICD-10-CM

## 2021-07-07 DIAGNOSIS — I10 Essential (primary) hypertension: Secondary | ICD-10-CM | POA: Diagnosis not present

## 2021-07-07 DIAGNOSIS — I2510 Atherosclerotic heart disease of native coronary artery without angina pectoris: Secondary | ICD-10-CM

## 2021-07-07 DIAGNOSIS — E782 Mixed hyperlipidemia: Secondary | ICD-10-CM | POA: Diagnosis not present

## 2021-07-07 NOTE — Progress Notes (Signed)
Cardiology Office Note  Date: 07/07/2021   ID: Scott Castaneda, DOB 1951/09/08, MRN [id number]  PCP:  Toma Deiters, MD  Cardiologist:  Dina Rich, MD Electrophysiologist:  None   Chief Complaint: 3 month follow up  History of Present Illness: Scott Castaneda is a 70 y.o. male with a history of CAD, HLD, HTN, COPD.  He was last seen by Dr. Wyline Mood on 04/06/2021.  Had no recent chest pain but some recent shortness of breath/DOE with high level of exertion which she reported was new.  He had no recent edema.  No longer taking Plavix only aspirin.  He was compliant with all of this medications.  Previously had muscle aches on simvastatin and changed to Crestor.  Plans were to obtain an exercise nuclear stress test for further evaluation.  He had a previous MI in 2008 without a recent ischemic evaluation.  Labs from PCP were requested and he was to continue statin and Toprol XL  He is here for follow-up status post recent echocardiogram and stress test.  We reviewed at length the stress test and echocardiogram.  He currently denies any anginal or exertional symptoms.  He continues to smoke.  He denies any DOE or SOB.  Heart rate today is 54 and regular.  Blood pressure 110/70. Stress test on May 28 showed findings consistent with prior inferior/inferoseptal MI.  It was determined to be an intermediate risk study based on EF.  There was no current myocardium at jeopardy.  Duke treadmill score of 7 with support low risk for major cardiac events.  EF was calculated at 41%.  Subsequent echocardiogram to correlate EF findings showed LVEF of 60 to 65%.  No WMA's.  Indeterminate diastolic parameters, trivial mitral valve regurgitation.  Patient had been having measurements of his heart rate registering in the 30s to 50s range on pulse oximeter as an various blood pressure measuring devices.  He states apparently he had been having premature beats and this may have affected the heart rate measurements.   He denies any episodes of symptomatic bradycardia.  He is on a beta-blocker.  His wife states he had a recent PCP visit and had an EKG which showed bundle branch block.  Recent EKG at last clinic visit showed no evidence of bundle branch block.  Past Medical History:  Diagnosis Date   CAD (coronary artery disease) 2008   PCI CONE 2008   COPD (chronic obstructive pulmonary disease) (HCC)    Enlarged prostate    History of heart attack    Hyperlipidemia    Hypertension     Past Surgical History:  Procedure Laterality Date   CORONARY/GRAFT ACUTE MI REVASCULARIZATION  2008   CONE   HERNIA REPAIR     LEFT GROIN    Current Outpatient Medications  Medication Sig Dispense Refill   alfuzosin (UROXATRAL) 10 MG 24 hr tablet Take 10 mg by mouth daily with breakfast.     aspirin 81 MG chewable tablet Chew 1 tablet by mouth daily at 6 (six) AM.     budesonide-formoterol (SYMBICORT) 160-4.5 MCG/ACT inhaler Inhale 2 puffs into the lungs 2 (two) times daily.     Cholecalciferol (VITAMIN D) 125 MCG (5000 UT) CAPS Take 1 capsule by mouth daily at 6 (six) AM.     cyanocobalamin 1000 MCG tablet Take 1 tablet by mouth daily at 6 (six) AM.     enalapril (VASOTEC) 5 MG tablet Take 5 mg by mouth daily.  esomeprazole (NEXIUM) 20 MG capsule Take 1 capsule by mouth daily at 6 (six) AM.     gabapentin (NEURONTIN) 300 MG capsule Take 300 mg by mouth at bedtime as needed.     Krill Oil 500 MG CAPS Take 1 capsule by mouth 2 (two) times daily.     methocarbamol (ROBAXIN) 750 MG tablet Take 1 tablet by mouth 3 (three) times daily as needed for muscle spasms.     metoprolol succinate (TOPROL-XL) 25 MG 24 hr tablet Take 12.5 mg by mouth daily.     naproxen sodium (ALEVE) 220 MG tablet Take 1 tablet by mouth 2 (two) times daily.     rosuvastatin (CRESTOR) 40 MG tablet Take 40 mg by mouth daily.     No current facility-administered medications for this visit.   Allergies:  Bee venom and Bupropion   Social  History: The patient  reports that he has been smoking cigarettes. He has a 46.00 pack-year smoking history. His smokeless tobacco use includes snuff.   Family History: The patient's family history includes Diabetes in his sister; Heart attack in his father; Heart disease in his father.   ROS:  Please see the history of present illness. Otherwise, complete review of systems is positive for none.  All other systems are reviewed and negative.   Physical Exam: VS:  BP 110/70   Pulse (!) 54   Ht 5\' 6"  (1.676 m)   Wt 160 lb 3.2 oz (72.7 kg)   SpO2 97%   BMI 25.86 kg/m , BMI Body mass index is 25.86 kg/m.  Wt Readings from Last 3 Encounters:  07/07/21 160 lb 3.2 oz (72.7 kg)  04/06/21 168 lb 9.6 oz (76.5 kg)  11/12/19 169 lb (76.7 kg)    General: Patient appears comfortable at rest. Neck: Supple, no elevated JVP or carotid bruits, no thyromegaly. Lungs: Clear to auscultation, nonlabored breathing at rest. Cardiac: Regular rate and rhythm, no S3 or significant systolic murmur, no pericardial rub. Extremities: No pitting edema, distal pulses 2+. Skin: Warm and dry. Musculoskeletal: No kyphosis. Neuropsychiatric: Alert and oriented x3, affect grossly appropriate.  ECG:  EKG Apr 06, 2021 sinus bradycardia with occasional premature ventricular complexes, inferior infarct, age undetermined, heart rate of 58.  Recent Labwork: No results found for requested labs within last 8760 hours.     Component Value Date/Time   CHOL  09/19/2007 0640    181        ATP III CLASSIFICATION:  <200     mg/dL   Desirable  09/21/2007  mg/dL   Borderline High  767-341    mg/dL   High   TRIG >=937 902 0640   HDL 31 (L) 09/19/2007 0640   CHOLHDL 5.8 09/19/2007 0640   VLDL 23 09/19/2007 0640   LDLCALC (H) 09/19/2007 0640    127        Total Cholesterol/HDL:CHD Risk Coronary Heart Disease Risk Table                     Men   Women  1/2 Average Risk   3.4   3.3    Other Studies Reviewed  Today:   Echocardiogram 05/14/2021  1. Apex is poorly visualzied. . Left ventricular ejection fraction, by estimation, is 60 to 65%. The left ventricle has normal function. The left ventricle has no regional wall motion abnormalities. Left ventricular diastolic parameters are indeterminate. 2. Right ventricular systolic function is normal. The right ventricular size is normal. 3. The  mitral valve is normal in structure. Trivial mitral valve regurgitation. No evidence of mitral stenosis. 4. The aortic valve is tricuspid. Aortic valve regurgitation is not visualized. No aortic stenosis is present. 5. The inferior vena cava is normal in size with greater than 50% respiratory variability, suggesting right atrial pressure of 3 mmHg.    Nuclear treadmill stress test 04/17/2021 Study Result  Narrative & Impression  Blood pressure demonstrated a normal response to exercise. There was no ST segment deviation noted during stress. PVCs at rest resolved with exercise, reoccurred during recovery Findings consistent with prior inferior/inferoseptal myocardial infarction. This is an intermediate risk study based on decreased LVEF. Consider correlating LVEF with echo. There is no current myocardium at jeopardy. Duke treadmill score of 7 would support low risk for major cardiac events. The left ventricular ejection fraction is moderately decreased (41%).       08/2007 cath CONCLUSION:  1. Acute diaphragmatic wall myocardial infarction with total occlusion      of the distal right coronary artery, 60% mid and 40% distal      stenosis in the circumflex artery, 40% narrowing of the proximal      LAD, and inferior wall akinesis with an estimated ejection fraction      of 45%-50%  2. Successful reperfusion aspiration thrombectomy and stenting of the      lesion in the distal right coronary with improvement in the      sentinel narrowing from 100% to 0% and improvement in the flow from      TIMI 0 to  TIMI 3 flow.   12/2020 CTA LE IMPRESSION: VASCULAR  Mild aortoiliac atherosclerotic changes without evidence of flow-limiting stenosis ( Aortic Atherosclerosis (ICD10-I70.0)). Patent bilateral lower extremity arteries with three-vessel runoff to the level of the ankle.  NON-VASCULAR  Colonic diverticulosis without evidence of diverticulitis. No acute intra-abdominal abnormality.     Assessment and Plan:  1. CAD in native artery   2. Mixed hyperlipidemia   3. Essential hypertension   4. Tobacco abuse    1. CAD in native artery Denies any current anginal or exertional symptoms.  Recent stress test showed no areas of myocardium in jeopardy.  There was evidence of previous inferior / inferoseptal MI.  Calculated EF was low on stress test and echocardiogram was ordered to correlate.  EF on echo showed LVEF of 60 to 65%.  No WMA's.  Indeterminate diastolic parameters, trivial mitral valve regurgitation.  Continue aspirin 81 mg daily.  Continue Toprol-XL 12.5 mg p.o. daily.   2. Mixed hyperlipidemia  Continue Crestor 40 mg p.o. daily.  3.  Essential hypertension Continue enalapril 5 mg daily.  Continue Toprol-XL 12.5 mg p.o. daily.  BP today 110/70.  4.  Tobacco abuse Long history of tobacco abuse with continued smoking.  Was very emphatic about he and his wife both stopping smoking given history of CAD.  Patient and wife both understand he needs to stop.  Medication Adjustments/Labs and Tests Ordered: Current medicines are reviewed at length with the patient today.  Concerns regarding medicines are outlined above.   Disposition: Follow-up with Dr. Wyline Mood or APP 6 months  Signed, Rennis Harding, NP 07/07/2021 4:29 PM    [facility] [address] [place], [place], [place] [address] Phone: [phone]; Fax: [phone]

## 2021-07-07 NOTE — Patient Instructions (Addendum)

## 2021-07-14 ENCOUNTER — Other Ambulatory Visit: Payer: Self-pay

## 2021-07-14 ENCOUNTER — Ambulatory Visit
Admission: EM | Admit: 2021-07-14 | Discharge: 2021-07-14 | Disposition: A | Discharge status: Home / Self Care | Payer: Worker's Compensation

## 2021-07-14 DIAGNOSIS — S51012A Laceration without foreign body of left elbow, initial encounter: Secondary | ICD-10-CM | POA: Diagnosis not present

## 2021-07-14 DIAGNOSIS — Z026 Encounter for examination for insurance purposes: Secondary | ICD-10-CM

## 2021-07-14 NOTE — ED Triage Notes (Signed)
Pt presents with laceration to left elbow from metal box

## 2021-07-14 NOTE — ED Provider Notes (Signed)
[facility]    CSN: [id number] Arrival date & time: 07/14/21  1346      History   Chief Complaint Chief Complaint  Patient presents with   Laceration    HPI Scott Castaneda is a 70 y.o. male.   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 TDAP up to date (last given 2 years ago). Bleeding well controlled Past Medical History:  Diagnosis Date   CAD (coronary artery disease) 2008   PCI CONE 2008   COPD (chronic obstructive pulmonary disease) (HCC)    Enlarged prostate    History of heart attack    Hyperlipidemia    Hypertension     There are no problems to display for this patient.   Past Surgical History:  Procedure Laterality Date   CORONARY/GRAFT ACUTE MI REVASCULARIZATION  2008   CONE   HERNIA REPAIR     LEFT GROIN       Home Medications    Prior to Admission medications   Medication Sig Start Date End Date Taking? Authorizing Provider  alfuzosin (UROXATRAL) 10 MG 24 hr tablet Take 10 mg by mouth daily with breakfast.    [provider]  aspirin 81 MG chewable tablet Chew 1 tablet by mouth daily at 6 (six) AM. 10/15/15   [provider]  budesonide-formoterol (SYMBICORT) 160-4.5 MCG/ACT inhaler Inhale 2 puffs into the lungs 2 (two) times daily.    [provider]  Cholecalciferol (VITAMIN D) 125 MCG (5000 UT) CAPS Take 1 capsule by mouth daily at 6 (six) AM.    [provider]  cyanocobalamin 1000 MCG tablet Take 1 tablet by mouth daily at 6 (six) AM.    [provider]  enalapril (VASOTEC) 5 MG tablet Take 5 mg by mouth daily.    [provider]  esomeprazole (NEXIUM) 20 MG capsule Take 1 capsule by mouth daily at 6 (six) AM.    [provider]  gabapentin (NEURONTIN) 300 MG capsule Take 300 mg by mouth at bedtime as needed.    [provider]  Providence Lanius 500 MG CAPS Take 1 capsule by  mouth 2 (two) times daily.    [provider]  methocarbamol (ROBAXIN) 750 MG tablet Take 1 tablet by mouth 3 (three) times daily as needed for muscle spasms. 06/23/21   [provider]  metoprolol succinate (TOPROL-XL) 25 MG 24 hr tablet Take 12.5 mg by mouth daily.    [provider]  naproxen sodium (ALEVE) 220 MG tablet Take 1 tablet by mouth 2 (two) times daily.    [provider]  rosuvastatin (CRESTOR) 40 MG tablet Take 40 mg by mouth daily.    [provider]    Family History Family History  Problem Relation Age of Onset   Heart disease Father    Heart attack Father        PCI   Diabetes Sister     Social History Social History   Tobacco Use   Smoking status: Every Day    Packs/day: 1.00    Years: 46.00    Pack years: 46.00    Types: Cigarettes   Smokeless tobacco: Current    Types: Snuff   Tobacco comments:    Has tried several times, never successful     Allergies   Bee venom and Bupropion   Review of Systems Review of Systems Pertinent negatives listed  in HPI   Physical Exam Triage Vital Signs ED Triage Vitals [07/14/21 1527]  Enc Vitals Group     BP      Pulse      Resp      Temp      Temp src      SpO2      Weight      Height      Head Circumference      Peak Flow      Pain Score 4     Pain Loc      Pain Edu?      Excl. in GC?    No data found.  Updated Vital Signs BP 122/70 (BP Location: Right Arm)   Pulse (!) 49   Temp 98 F (36.7 C) (Oral)   Resp 16   SpO2 96%   Visual Acuity Right Eye Distance:   Left Eye Distance:   Bilateral Distance:    Right Eye Near:   Left Eye Near:    Bilateral Near:     Physical Exam  General appearance: Alert, well developed, well nourished, cooperative  Head: Normocephalic, without obvious abnormality, atraumatic Respiratory: Respirations even and unlabored, normal respiratory rate Heart: Rate and rhythm normal.  Extremities: Left elbow  laceration 5.5 com, poorly approximated wound 1 mm depth Skin: Skin color, texture, turgor normal. No rashes seen  Psych: Appropriate mood and affect. Neurologic: GCS 15, normal coordination, normal gait  (all labs ordered are listed, but only abnormal results are displayed) Labs Reviewed - No data to display  EKG   Radiology No results found.  Procedures Laceration Repair  Date/Time: 07/14/2021 3:45 PM Performed by: Bing Neighbors, FNP Authorized by: Bing Neighbors, FNP   Consent:    Consent obtained:  Verbal   Risks discussed:  Infection and pain   Alternatives discussed:  No treatment Universal protocol:    Patient identity confirmed:  Verbally with patient Anesthesia:    Anesthesia method:  Topical application and local infiltration   Local anesthetic:  Lidocaine 2% WITH epi Laceration details:    Location:  Shoulder/arm   Shoulder/arm location:  L elbow   Length (cm):  5.5   Depth (mm):  1 Treatment:    Area cleansed with:  Povidone-iodine   Amount of cleaning:  Standard   Irrigation solution:  Sterile saline Skin repair:    Repair method:  Sutures   Suture size:  4-0   Number of sutures:  4 Approximation:    Approximation:  Loose Repair type:    Repair type:  Simple Post-procedure details:    Dressing:  Bulky dressing and non-adherent dressing   Procedure completion:  Tolerated (including critical care time)  Medications Ordered in UC Medications - No data to display  Initial Impression / Assessment and Plan / UC Course  I have reviewed the triage vital signs and the nursing notes.  Pertinent labs & imaging results that were available during my care of the patient were reviewed by me and considered in my medical decision making (see chart for details).     Elbow laceration, left  Tolerated repair. Return in 10 days for suture removal. Wound care instruction given Final Clinical Impressions(s) / UC Diagnoses   Final diagnoses:  Elbow  laceration, left, initial encounter  Encounter related to worker's compensation claim   Discharge Instructions   None    ED Prescriptions   None    PDMP not reviewed this encounter.   Joaquin Courts  S, FNP 07/21/21 2150

## 2021-07-23 ENCOUNTER — Other Ambulatory Visit (HOSPITAL_BASED_OUTPATIENT_CLINIC_OR_DEPARTMENT_OTHER): Payer: Self-pay | Admitting: Nurse Practitioner

## 2021-07-23 ENCOUNTER — Ambulatory Visit
Admission: EM | Admit: 2021-07-23 | Discharge: 2021-07-23 | Disposition: A | Discharge status: Home / Self Care | Payer: Commercial Managed Care - PPO | Attending: Emergency Medicine | Admitting: Emergency Medicine

## 2021-07-23 ENCOUNTER — Other Ambulatory Visit: Payer: Self-pay

## 2021-07-23 ENCOUNTER — Other Ambulatory Visit (HOSPITAL_COMMUNITY): Payer: Self-pay | Admitting: Nurse Practitioner

## 2021-07-23 ENCOUNTER — Encounter: Payer: Self-pay | Admitting: *Deleted

## 2021-07-23 DIAGNOSIS — M545 Low back pain, unspecified: Secondary | ICD-10-CM

## 2021-07-23 NOTE — ED Notes (Signed)
Four sutures removed from Lt posterior arm. Pt reports he thinks he pulled 2 sutures out.

## 2021-07-23 NOTE — ED Triage Notes (Signed)
Pt here for suture removal

## 2021-08-05 ENCOUNTER — Ambulatory Visit (HOSPITAL_COMMUNITY): Payer: No Typology Code available for payment source

## 2021-08-05 ENCOUNTER — Encounter (HOSPITAL_COMMUNITY): Payer: Self-pay

## 2021-08-12 ENCOUNTER — Ambulatory Visit (HOSPITAL_COMMUNITY)
Admission: RE | Admit: 2021-08-12 | Discharge: 2021-08-12 | Disposition: A | Discharge status: Home / Self Care | Payer: No Typology Code available for payment source | Source: Ambulatory Visit | Attending: Nurse Practitioner | Admitting: Nurse Practitioner

## 2021-08-12 ENCOUNTER — Other Ambulatory Visit: Payer: Self-pay

## 2021-08-12 DIAGNOSIS — M545 Low back pain, unspecified: Secondary | ICD-10-CM | POA: Insufficient documentation

## 2021-12-17 ENCOUNTER — Telehealth: Payer: Self-pay | Admitting: *Deleted

## 2021-12-17 NOTE — Telephone Encounter (Signed)
° °  Pre-operative Risk Assessment    Patient Name: Scott Castaneda  DOB: 12-Jun-1951 MRN: [id number]      Request for Surgical Clearance    Procedure:   left shoulder arthroscopy rotator cuff repair  Date of Surgery:  Clearance 12/23/21                                 Surgeon:  Dr. Malon Kindle  Surgeon's Group or Practice Name:  [facility] Phone number:  [phone] Fax number:  [phone]   Type of Clearance Requested:   - Medical  - Pharmacy:  Hold Aspirin please give recommendations   Type of Anesthesia:  General    Additional requests/questions:    SignedThalia Bloodgood   12/17/2021, 12:50 PM

## 2021-12-17 NOTE — Telephone Encounter (Signed)
Left a message for the patient to call back and speak to the on-call preop APP of the day.  Dr. Wyline Mood to review, patient had remote MI in 2008.  Last Myoview obtained on 04/17/2021 showed EF 41%, prior inferior and inferoseptal myocardial infarction, no ischemia.  Last echocardiogram obtained on 05/14/2021 showed EF 60 to 65%, trivial MR.  As long as the patient does not have any chest pain, would you be okay with him holding the aspirin for 5 days prior to the surgery?  Please forward your response to P [facility] DIV PREOP

## 2021-12-18 NOTE — Telephone Encounter (Signed)
Pt is returning call.  

## 2021-12-22 NOTE — Telephone Encounter (Signed)
Ok to proceed with surgery, can hold aspirin if needed 7 days prior to surgery  Dominga Ferry MD

## 2021-12-22 NOTE — Telephone Encounter (Signed)
° °  Name: Scott Castaneda  DOB: 17-Sep-1951  MRN: [id number]   Primary Cardiologist: Carlyle Dolly, MD  Chart reviewed as part of pre-operative protocol coverage.   Per Dr. Harl Bowie: OK to proceed with surgery and can hold ASA for 7 days if needed.   I will route this recommendation to the requesting party via Epic fax function and remove from pre-op pool. Please call with questions.  Tami Lin Sheral Pfahler, PA 12/22/2021, 5:13 PM

## 2022-01-08 ENCOUNTER — Encounter: Payer: Self-pay | Admitting: Cardiology

## 2022-01-08 ENCOUNTER — Other Ambulatory Visit: Payer: Self-pay

## 2022-01-08 ENCOUNTER — Ambulatory Visit (INDEPENDENT_AMBULATORY_CARE_PROVIDER_SITE_OTHER): Payer: Commercial Managed Care - PPO | Admitting: Cardiology

## 2022-01-08 ENCOUNTER — Ambulatory Visit: Payer: Commercial Managed Care - PPO | Admitting: Cardiology

## 2022-01-08 VITALS — BP 118/66 | HR 60 | Ht 66.0 in | Wt 153.6 lb

## 2022-01-08 DIAGNOSIS — E782 Mixed hyperlipidemia: Secondary | ICD-10-CM | POA: Diagnosis not present

## 2022-01-08 DIAGNOSIS — I2510 Atherosclerotic heart disease of native coronary artery without angina pectoris: Secondary | ICD-10-CM | POA: Diagnosis not present

## 2022-01-08 NOTE — Progress Notes (Signed)
Clinical Summary Mr. Trostel is a 71 y.o.male seen today for follow up of the following medical problems.   1. CAD - 08/2007 history of inferior STEMI, found to have an occluded RCA and received a DES - at that time LVEF 45-50% - most recently had been followed by cardiology at [facility], last visit roughly 2012     03/2021 nuclear stress: no ischemic EKG changes, inferior/inferoseptal infarct, no ischemia. DUke treadmill score of 7.  04/2021 echo: LVEF 60-65%, no WMAs, indet diastolic  - no recent chest pains - no SOB/DOE      2. Hyperlipidemia - muscle aches on simvastatin, changed to crestor with resolution.  - labs followed at [facility]    3. Shoulder pain -recent shoudler surgery    Reports [facility] [place] [facility].   PMH 1. CAD 2. Hyperlipidemia   Past Medical History:  Diagnosis Date   CAD (coronary artery disease) 2008   PCI CONE 2008   COPD (chronic obstructive pulmonary disease) (HCC)    Enlarged prostate    History of heart attack    Hyperlipidemia    Hypertension      Allergies  Allergen Reactions   Bee Venom Rash and Swelling   Bupropion Anxiety     Current Outpatient Medications  Medication Sig Dispense Refill   alfuzosin (UROXATRAL) 10 MG 24 hr tablet Take 10 mg by mouth daily with breakfast.     aspirin 81 MG chewable tablet Chew 1 tablet by mouth daily at 6 (six) AM.     budesonide-formoterol (SYMBICORT) 160-4.5 MCG/ACT inhaler Inhale 2 puffs into the lungs 2 (two) times daily.     Cholecalciferol (VITAMIN D) 125 MCG (5000 UT) CAPS Take 1 capsule by mouth daily at 6 (six) AM.     cyanocobalamin 1000 MCG tablet Take 1 tablet by mouth daily at 6 (six) AM.     enalapril (VASOTEC) 5 MG tablet Take 5 mg by mouth daily.     esomeprazole (NEXIUM) 20 MG capsule Take 1 capsule by mouth daily at 6 (six) AM.     gabapentin (NEURONTIN) 300 MG capsule Take 300 mg by mouth at bedtime as needed.     Krill Oil 500 MG CAPS Take 1 capsule by mouth 2 (two)  times daily.     methocarbamol (ROBAXIN) 750 MG tablet Take 1 tablet by mouth 3 (three) times daily as needed for muscle spasms.     metoprolol succinate (TOPROL-XL) 25 MG 24 hr tablet Take 12.5 mg by mouth daily.     naproxen sodium (ALEVE) 220 MG tablet Take 1 tablet by mouth 2 (two) times daily.     rosuvastatin (CRESTOR) 40 MG tablet Take 40 mg by mouth daily.     No current facility-administered medications for this visit.     Past Surgical History:  Procedure Laterality Date   CORONARY/GRAFT ACUTE MI REVASCULARIZATION  2008   CONE   HERNIA REPAIR     LEFT GROIN     Allergies  Allergen Reactions   Bee Venom Rash and Swelling   Bupropion Anxiety      Family History  Problem Relation Age of Onset   Heart disease Father    Heart attack Father        PCI   Diabetes Sister      Social History Mr. Sparks reports that he has been smoking cigarettes. He has a 46.00 pack-year smoking history. His smokeless tobacco use includes snuff. Mr. Baria has no  history on file for alcohol use.   Review of Systems CONSTITUTIONAL: No weight loss, fever, chills, weakness or fatigue.  HEENT: Eyes: No visual loss, blurred vision, double vision or yellow sclerae.No hearing loss, sneezing, congestion, runny nose or sore throat.  SKIN: No rash or itching.  CARDIOVASCULAR: per hpi RESPIRATORY: No shortness of breath, cough or sputum.  GASTROINTESTINAL: No anorexia, nausea, vomiting or diarrhea. No abdominal pain or blood.  GENITOURINARY: No burning on urination, no polyuria NEUROLOGICAL: No headache, dizziness, syncope, paralysis, ataxia, numbness or tingling in the extremities. No change in bowel or bladder control.  MUSCULOSKELETAL: No muscle, back pain, joint pain or stiffness.  LYMPHATICS: No enlarged nodes. No history of splenectomy.  PSYCHIATRIC: No history of depression or anxiety.  ENDOCRINOLOGIC: No reports of sweating, cold or heat intolerance. No polyuria or polydipsia.   Marland Kitchen   Physical Examination Today's Vitals   01/08/22 0810  BP: 118/66  Pulse: 60  SpO2: 96%  Weight: 153 lb 9.6 oz (69.7 kg)  Height: 5\' 6"  (1.676 m)   Body mass index is 24.79 kg/m.  Gen: resting comfortably, no acute distress HEENT: no scleral icterus, pupils equal round and reactive, no palptable cervical adenopathy,  CV: RRR, no m/r/g no jvd Resp: Clear to auscultation bilaterally GI: abdomen is soft, non-tender, non-distended, normal bowel sounds, no hepatosplenomegaly MSK: extremities are warm, no edema.  Skin: warm, no rash Neuro:  no focal deficits Psych: appropriate affect   Diagnostic Studies  08/2007 cath CONCLUSION:  1. Acute diaphragmatic wall myocardial infarction with total occlusion      of the distal right coronary artery, 60% mid and 40% distal      stenosis in the circumflex artery, 40% narrowing of the proximal      LAD, and inferior wall akinesis with an estimated ejection fraction      of 45%-50%  2. Successful reperfusion aspiration thrombectomy and stenting of the      lesion in the distal right coronary with improvement in the      sentinel narrowing from 100% to 0% and improvement in the flow from      TIMI 0 to TIMI 3 flow.   12/2020 CTA LE IMPRESSION: VASCULAR  Mild aortoiliac atherosclerotic changes without evidence of flow-limiting stenosis ( Aortic Atherosclerosis (ICD10-I70.0)). Patent bilateral lower extremity arteries with three-vessel runoff to the level of the ankle.  NON-VASCULAR  Colonic diverticulosis without evidence of diverticulitis. No acute intra-abdominal abnormality.     03/2021 nuclear stress Blood pressure demonstrated a normal response to exercise. There was no ST segment deviation noted during stress. PVCs at rest resolved with exercise, reoccurred during recovery Findings consistent with prior inferior/inferoseptal myocardial infarction. This is an intermediate risk study based on decreased LVEF. Consider  correlating LVEF with echo. There is no current myocardium at jeopardy. Duke treadmill score of 7 would support low risk for major cardiac events. The left ventricular ejection fraction is moderately decreased (41%).  04/2021 echo 1. Apex is poorly visualzied. . Left ventricular ejection fraction, by  estimation, is 60 to 65%. The left ventricle has normal function. The left  ventricle has no regional wall motion abnormalities. Left ventricular  diastolic parameters are  indeterminate.   2. Right ventricular systolic function is normal. The right ventricular  size is normal.   3. The mitral valve is normal in structure. Trivial mitral valve  regurgitation. No evidence of mitral stenosis.   4. The aortic valve is tricuspid. Aortic valve regurgitation is not  visualized.  No aortic stenosis is present.   5. The inferior vena cava is normal in size with greater than 50%  respiratory variability, suggesting right atrial pressure of 3 mmHg.    Assessment and Plan   1. CAD - no significant symptoms - stress test and echo last year overall looked good - continue current meds   2. Hyperlipidemia - continue crestor, request labs from pcp  F/u 1 year   Antoine Poche, M.D.

## 2022-01-08 NOTE — Patient Instructions (Addendum)
Labwork: We have requested your most recent lab work from: [facility]  Follow-Up: Follow up with Dr. Wyline Mood in 1 year  If you need a refill on your cardiac medications before your next appointment, please call your pharmacy.

## 2023-03-09 IMAGING — MR MR LUMBAR SPINE W/O CM
4 of 5 series · 30 of 48 positions shown · non-contrast
Comparison: Lumbar radiographs 12/09/2020.

CLINICAL DATA: 70-year-old male with persistent low back pain
radiating down both legs. No known injury.

EXAM:
MRI LUMBAR SPINE WITHOUT CONTRAST
TECHNIQUE: Multiplanar, multisequence MR imaging of the lumbar spine was
performed. No intravenous contrast was administered.

[Series 5: T2 · sagittal · 4.0mm · 0.68mm/px · 7 of 15 slices shown (1 of 2)]
[im 1/15]
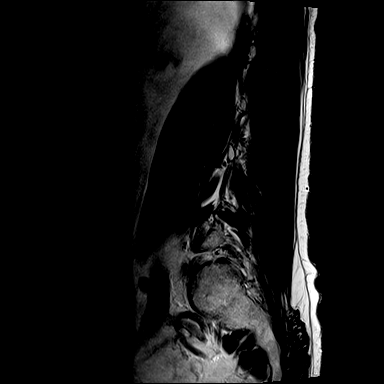
[im 3/15]
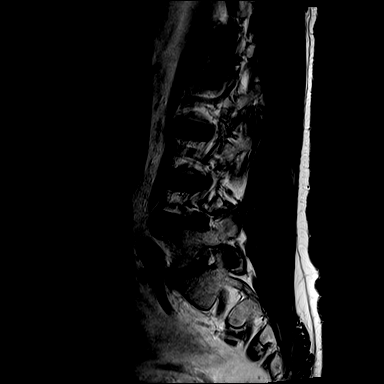
[im 5/15]
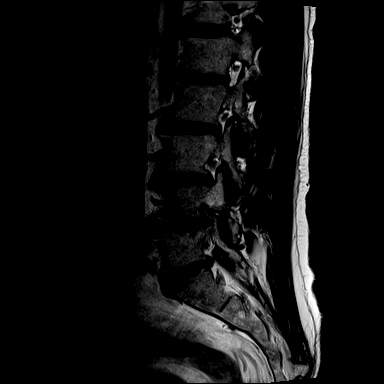
[im 8/15]
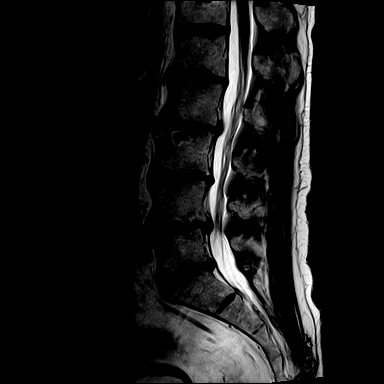
[im 10/15]
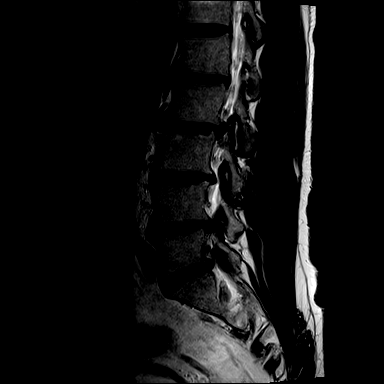
[im 12/15]
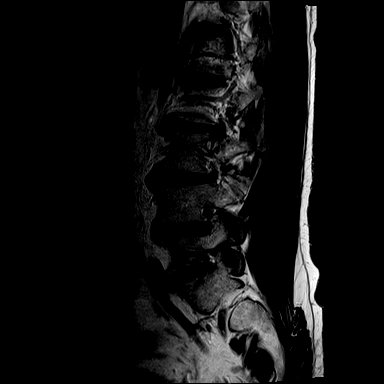
[im 15/15]
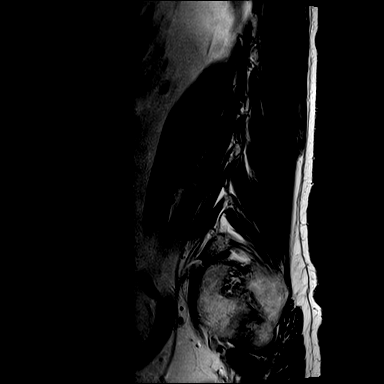

[Series 6: T1 · sagittal · 4.0mm · 0.81mm/px · 7 of 15 slices shown (1 of 2)]
[im 1/15]
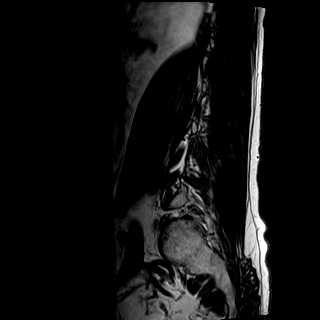
[im 3/15]
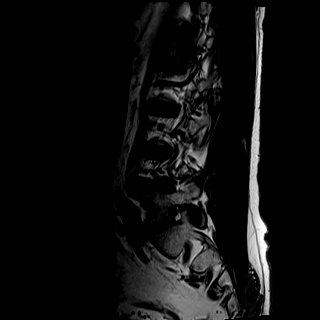
[im 5/15]
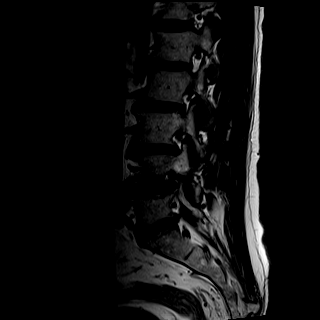
[im 8/15]
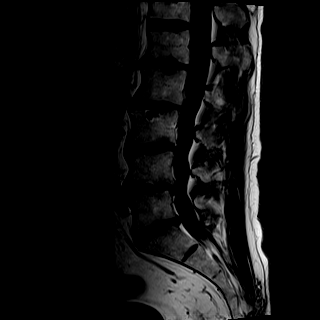
[im 10/15]
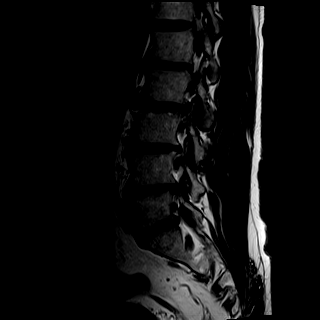
[im 12/15]
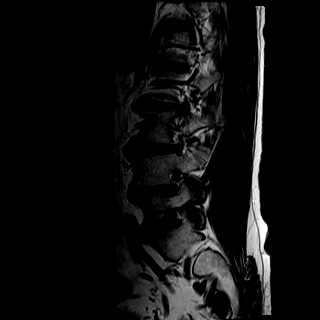
[im 15/15]
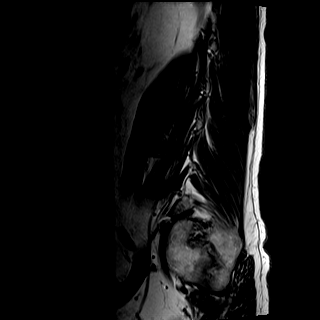

[Series 8: T2 · axial · 4.0mm · 0.70mm/px · z∈[-108,+65]mm · 8 of 33 slices shown (2 of 2)]
[im 1/33]
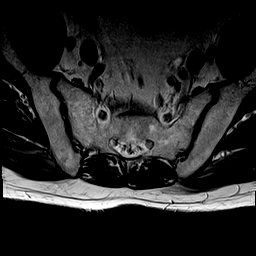
[im 5/33]
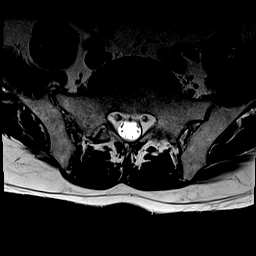
[im 10/33]
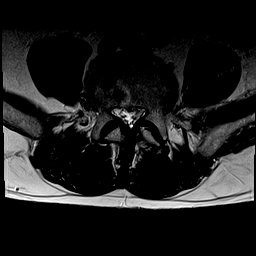
[im 15/33]
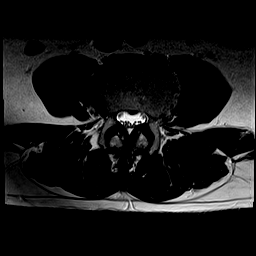
[im 18/33]
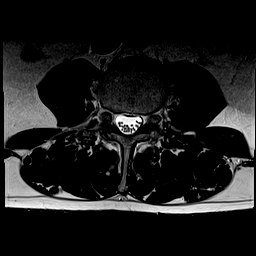
[im 23/33]
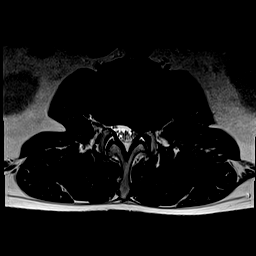
[im 28/33]
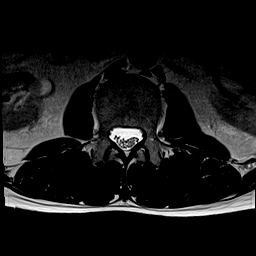
[im 33/33]
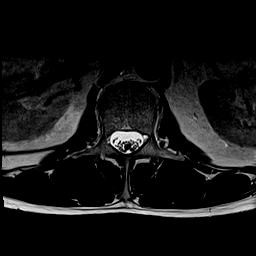

[Series 9: T1 · axial · 4.0mm · 0.35mm/px · z∈[-108,+65]mm · 8 of 33 slices shown (2 of 2)]
[im 1/33]
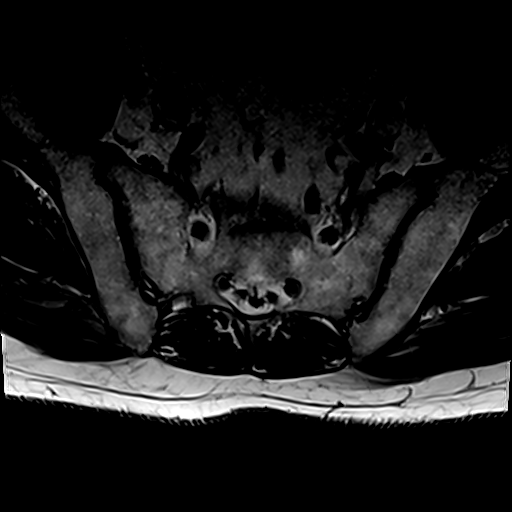
[im 5/33]
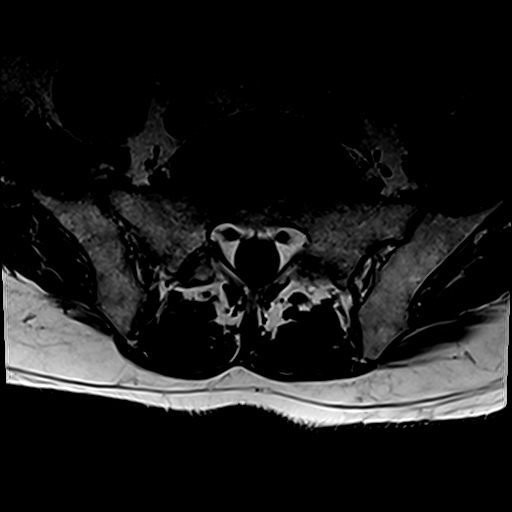
[im 10/33]
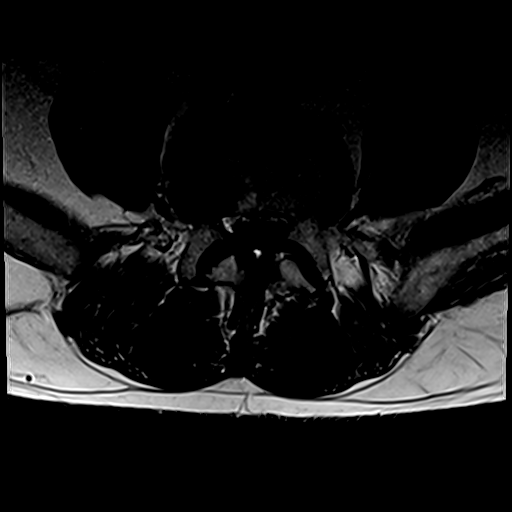
[im 15/33]
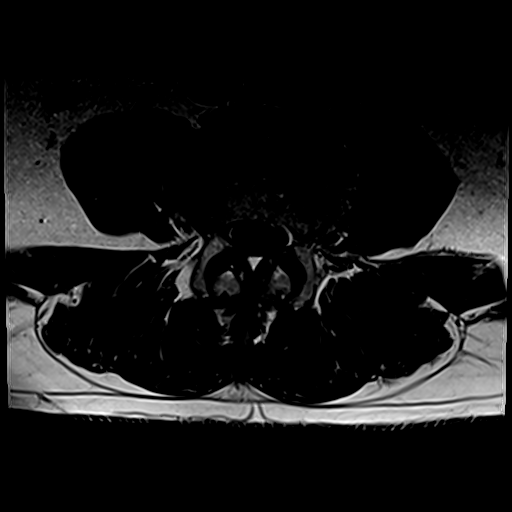
[im 18/33]
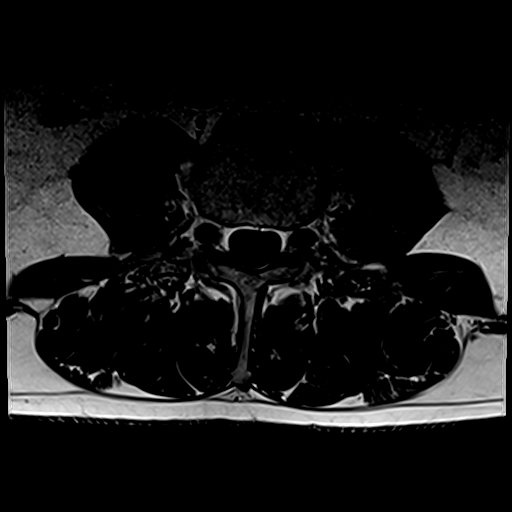
[im 23/33]
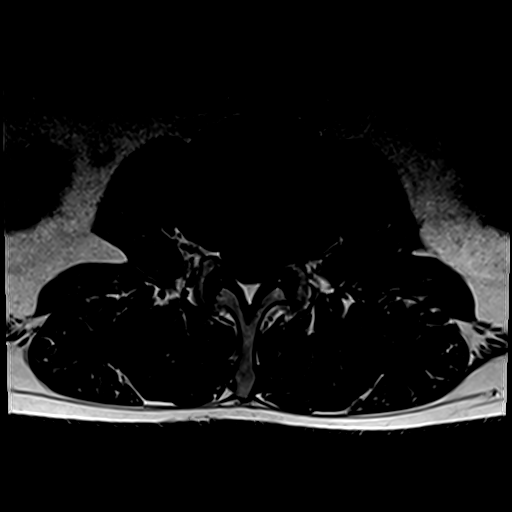
[im 28/33]
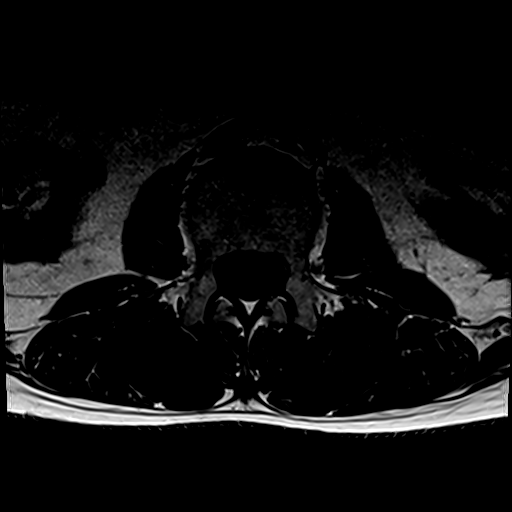
[im 33/33]
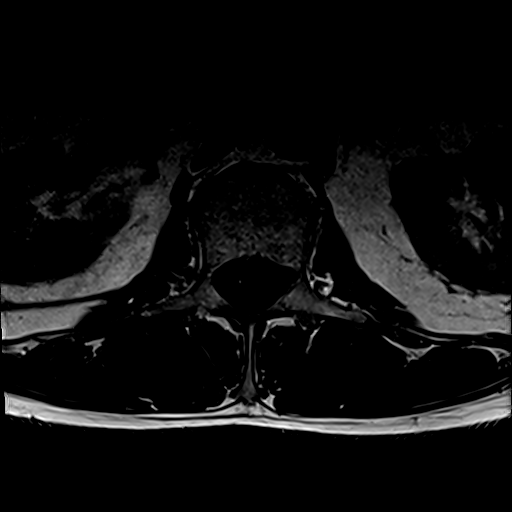

[30 of 48 positions shown; findings below may reference images not displayed]

FINDINGS: Segmentation:  Normal on the comparison radiographs.

Alignment: Stable straightening of lumbar lordosis since [REDACTED].
There is superimposed multilevel mild retrolisthesis in the lumbar
spine including L2 on L3, L4 on L5, L5 on S1.

Vertebrae: Faint degenerative endplate marrow edema inferiorly at L3
and L4. Mild superior L5 endplate involvement on the right.
Superimposed multiple chronic endplate Schmorl's nodes in the lumbar
spine. Background bone marrow signal is within normal limits. Intact
visible sacrum and SI joints.

Conus medullaris and cauda equina: Conus extends to the L1 level. No
lower spinal cord or conus signal abnormality.

Paraspinal and other soft tissues: Negative.

Disc levels:

T12-L1:  Negative.

L1-L2: Disc desiccation, disc space loss and chronic Schmorl's
nodes. No stenosis.

L2-L3: Mild retrolisthesis. Disc space loss and circumferential disc
bulge. Superimposed left lateral recess disc extrusion with evidence
of a caudally extending sequestered disc fragment in the left
lateral recess, approximately 5 mm on series 8, image 13. Severe
left lateral recess stenosis (left L3 nerve level). Moderate to
severe superimposed left L2 neural foraminal stenosis. No
significant spinal stenosis. Mild contralateral right foraminal
stenosis.

L3-L4: Disc desiccation with disc space loss. Circumferential disc
bulge and endplate spurring. Mild facet and ligament flavum
hypertrophy. Mild spinal stenosis. Moderate left and mild right L3
neural foraminal stenosis.

L4-L5: Disc space loss with bulky circumferential disc bulge or
protrusion. Mild facet and ligament flavum hypertrophy. Moderate
spinal stenosis. Mild to moderate lateral recess stenosis greater on
the right (L5 nerve levels). Mild to moderate left and severe right
L4 neural foraminal stenosis (series 5, image 4).

L5-S1: Disc space loss with circumferential disc osteophyte complex,
bulky left far lateral component. No spinal or lateral recess
stenosis. Moderate to severe left and mild to moderate right L5
foraminal stenosis.
IMPRESSION: 1. The most symptomatic level is favored to be L2-L3 where a disc
extrusion into the left lateral recess disc extrusion is associated
with a small with caudally extending sequestered disc fragment
(series 8, images 12 and 13). Severe involvement of the left lateral
recess, moderate to severe left foraminal involvement. Query Left L2
and/or L3 radiculitis.

2. But there is also moderate multifactorial spinal stenosis at
L4-L5, mild spinal stenosis at L3-L4.
And also moderate or severe left side neural foraminal stenosis at
the L4 and L5 nerve levels.

3. Severe right L4 neural foraminal stenosis.

## 2023-03-14 NOTE — Progress Notes (Unsigned)
Cardiology Office Note    Date:  03/15/2023   ID:  Scott Castaneda, DOB Mar 07, 1951, MRN [id number]  PCP:  Toma Deiters, MD  Cardiologist:  Dina Rich, MD  Electrophysiologist:  None   Chief Complaint: f/u CAD, several questions about medication, cholesterol, leg weakness, contemplating Urolift surgery  History of Present Illness:   Scott Castaneda is a 73 y.o. male with history of CAD with inferior STEMI 2008 s/p DES to RCA, ICM with recovered EF (45-50% by cath 2008, normalized since then), HLD, COPD, enlarged prostate, HTN, PVCs by EKG who is seen for follow-up. He had remote MI/PCI in 2008. At the time cath showed total occlusion of the distal right coronary artery, 60% mid and 40% distal stenosis in the circumflex artery, 40% narrowing of the proximal LAD, and inferior wall akinesis with an estimated ejection fraction of 45%-50%. He had thrombectomy and stenting of the dRCA. He was followed for a period of time by [facility] cardiology then established care here in 2022. NST 03/2021 showed no ischemic EKG changes, inferior/inferoseptal infarct, no ischemia. Echo showed EF 60-65%, indeterminate diastolic parameters, trivial MR. Prior EKG demonstrated NSR with PVCs. He relays a history of what sounds like bradysphigmia (pseudobradycardia) as well. He also recalls being told he had a blockage in his carotids at the [place] several years ago but denies interim testing.  He is seen for follow-up today. Overall he feels he is doing well without any recent CP. He has noticed a tendency for some generalized fatigue and leg weakness. The leg weakness seems more prominent in the morning. He has lost about 10 lbs though states this previously fluctuated up and down - he reports he hasn't been really trying though felt like he had pounds to lose. He is using Chantix and wonders if this is causing his fatigue. He is able to perform over 4 METS without angina or dyspnea. He thinks that the rosuvastatin may  be contributing to the leg weakness. He recalls having issues when he was on simvastatin 80mg . He is not having any claudication. He's been able to cut down to 5 cigarettes a day using Chantix. He is contemplating having urolift surgery. EKG shows intermittent PVCs today, slightly different morphology form between the two. He is unaware of these.   Labwork independently reviewed: [facility] - 12/2022 LDL 39, trig 62, HGb 16.2, plt 148, K 4.2, Cr 0.956, Na 139, LFTs OK Scan 03/2021 Hgb 16.5, plt 172, K 3.9, Cr 0.98, LFTs ok, TSH wnl, trig 96, LDL 57   Past History   Past Medical History:  Diagnosis Date   CAD (coronary artery disease) 2008   PCI [facility] 2008   COPD (chronic obstructive pulmonary disease)    Enlarged prostate    History of heart attack    Hyperlipidemia    Hypertension    Ischemic cardiomyopathy    45-50% by cath 2008, normalized since then    Past Surgical History:  Procedure Laterality Date   CORONARY/GRAFT ACUTE MI REVASCULARIZATION  2008   [facility]   HERNIA REPAIR     LEFT GROIN    Current Medications: Current Meds  Medication Sig   albuterol (PROAIR HFA) 108 (90 Base) MCG/ACT inhaler ProAir HFA 90 mcg/actuation aerosol inhaler   alfuzosin (UROXATRAL) 10 MG 24 hr tablet Take 10 mg by mouth daily with breakfast.   aspirin 81 MG chewable tablet Chew 1 tablet by mouth daily at 6 (six) AM.   Cholecalciferol (  VITAMIN D) 125 MCG (5000 UT) CAPS Take 1 capsule by mouth daily at 6 (six) AM.   cyanocobalamin 1000 MCG tablet Take 1 tablet by mouth daily at 6 (six) AM.   enalapril (VASOTEC) 5 MG tablet Take 5 mg by mouth daily.   Krill Oil 500 MG CAPS Take 1 capsule by mouth 2 (two) times daily.   metoprolol succinate (TOPROL-XL) 25 MG 24 hr tablet Take 12.5 mg by mouth daily.   naproxen sodium (ALEVE) 220 MG tablet Take 1 tablet by mouth 2 (two) times daily.   rosuvastatin (CRESTOR) 40 MG tablet Take 40 mg by mouth daily.   tadalafil (CIALIS) 5 MG tablet Take 5 mg by  mouth daily.   varenicline (CHANTIX) 1 MG tablet TAKE ONE-HALF TABLET BY MOUTH DAILY FOR 3 DAYS, THEN TAKE ONE-HALF TABLET TWICE A DAY FOR 4 DAYS, THEN TAKE ONE TABLET TWICE A DAY FOR 11 WEEKS THEREAFTER FOR AT LEAST 11 WEEKS FOR TOBACCO CESSATION      Allergies:   Bee venom and Bupropion   Social History   Socioeconomic History   Marital status: Married    Spouse name: Not on file   Number of children: Not on file   Years of education: Not on file   Highest education level: Not on file  Occupational History   Not on file  Tobacco Use   Smoking status: Every Day    Packs/day: 0.50    Years: 46.00    Additional pack years: 0.00    Total pack years: 23.00    Types: Cigarettes   Smokeless tobacco: Never   Tobacco comments:    Has tried several times, never successful  Vaping Use   Vaping Use: Never used  Substance and Sexual Activity   Alcohol use: Yes    Alcohol/week: 6.0 standard drinks of alcohol    Types: 6 Cans of beer per week   Drug use: Never   Sexual activity: Not on file  Other Topics Concern   Not on file  Social History Narrative   Not on file   Social Determinants of Health   Financial Resource Strain: Not on file  Food Insecurity: Not on file  Transportation Needs: Not on file  Physical Activity: Not on file  Stress: Not on file  Social Connections: Not on file     Family History:  The patient's family history includes Diabetes in his sister; Heart attack in his father; Heart disease in his father.  ROS:   Please see the history of present illness.  All other systems are reviewed and otherwise negative.    EKG(s)/Additional Testing   EKG:  EKG is ordered today, personally reviewed, demonstrating SB 55bpm, occasional PVCs, prior inferior and anterior infarct, nonspecific TW changes, no acute change from prior.  CV Studies: Cardiac studies reviewed are outlined and summarized above. Otherwise please see EMR for full report.  Recent Labs: No  results found for requested labs within last 365 days.  Recent Lipid Panel    Component Value Date/Time   CHOL  09/19/2007 0640    181        ATP III CLASSIFICATION:  <200     mg/dL   Desirable  161-096  mg/dL   Borderline High  >=045    mg/dL   High   TRIG 409 81/19/1478 0640   HDL 31 (L) 09/19/2007 0640   CHOLHDL 5.8 09/19/2007 0640   VLDL 23 09/19/2007 0640   LDLCALC (H) 09/19/2007 2956  127        Total Cholesterol/HDL:CHD Risk Coronary Heart Disease Risk Table                     Men   Women  1/2 Average Risk   3.4   3.3    PHYSICAL EXAM:    VS:  BP 116/60   Pulse (!) 54   Ht  (1.676 m)   Wt 159 lb 12.8 oz (72.5 kg)   SpO2 98%   BMI 25.79 kg/m   BMI: Body mass index is 25.79 kg/m.  GEN: Well nourished, well developed male in no acute distress HEENT: normocephalic, atraumatic Neck: no JVD or masses. + L carotid bruit Cardiac: RRR occasional ectopy; no murmurs, rubs, or gallops, no edema  Respiratory:  clear to auscultation bilaterally, normal work of breathing GI: soft, nontender, nondistended, + BS MS: no deformity or atrophy Skin: warm and dry, no rash Neuro:  Alert and Oriented x 3, Strength and sensation are intact, follows commands Psych: euthymic mood, full affect  Wt Readings from Last 3 Encounters:  03/15/23 159 lb 12.8 oz (72.5 kg)  01/08/22 153 lb 9.6 oz (69.7 kg)  07/07/21 160 lb 3.2 oz (72.7 kg)     ASSESSMENT & PLAN:   1. Preop cardiovascular evaluation, generalized fatigue, leg weakness - overall my gestalt is that he is doing well. He does note nonspecific fatigue that he has wondered may be due to the Chantix and leg weakness that he feels might be related to his rosuvastatin. He denies claudication. He was concerned that his recent LDL of 39 was unheard of / too low but we discussed this was actually at goal. Per shared decision making he would like to trial reducing his rosuvastatin dose to  daily. He will call with update in 2  weeks of how he is feeling. Given the vague nature of his fatigue, we'll also get updated bloodwork in the office and repeat his echocardiogram with a 3 day Zio to quantify PVC burden. His nuclear stress test in 2022 - with PVCs noted - was nonischemic so do not think it is necessary to repeat this as long as LVEF has remained normal. We'll also plan to update his carotid duplex as below. If studies are reassuring I think it would be OK for him to go ahead with prostate surgery since he can complete 7.92 METS without CP/dyspnea but will need to make final commentary on this once the testing is complete. His urology follow-up is not until later in May.   2. CAD - update echo as above. Continue ASA.  Continue metoprolol. See above re: statin.  3. Ischemic cardiomyopathy with recovered EF - echo planned above.  4. Essential HTN - controlled, no changes made today.  5. Hyperlipidemia - as above, reduce rosuvastatin to  daily as patient desires trial of reduction. He will call us in 2 weeks with update on leg weakness. Will tentatively plan f/u labs in 3 months to reassess lipid control.  6. PVCs - get lytes/labs today, plan otherwise as above.  7. L carotid bruit - reports h/o carotid artery disease, no records available. Update carotid duplex.     Disposition: F/u with me in 3 months to review above testing. Also advised seeing PCP about weight trends.   Medication Adjustments/Labs and Tests Ordered: Current medicines are reviewed at length with the patient today.  Concerns regarding medicines are outlined above. Medication changes, Labs and Tests ordered  today are summarized above and listed in the Patient Instructions accessible in Encounters.    Signed, Laurann Montana, PA-C  03/15/2023 3:32 PM    [facility] [place] Location in [facility] [address] [place], [place] [address] Ph: [phone]; Fax [phone]

## 2023-03-15 ENCOUNTER — Ambulatory Visit: Payer: No Typology Code available for payment source

## 2023-03-15 ENCOUNTER — Other Ambulatory Visit (HOSPITAL_COMMUNITY)
Admission: RE | Admit: 2023-03-15 | Discharge: 2023-03-15 | Disposition: A | Discharge status: Home / Self Care | Payer: Commercial Managed Care - PPO | Source: Ambulatory Visit | Attending: Physician Assistant | Admitting: Physician Assistant

## 2023-03-15 ENCOUNTER — Ambulatory Visit (INDEPENDENT_AMBULATORY_CARE_PROVIDER_SITE_OTHER): Payer: Commercial Managed Care - PPO

## 2023-03-15 ENCOUNTER — Encounter: Payer: Self-pay | Admitting: Physician Assistant

## 2023-03-15 ENCOUNTER — Ambulatory Visit: Payer: Commercial Managed Care - PPO | Attending: Physician Assistant | Admitting: Physician Assistant

## 2023-03-15 VITALS — BP 116/60 | HR 54 | Ht 66.0 in | Wt 159.8 lb

## 2023-03-15 DIAGNOSIS — I2510 Atherosclerotic heart disease of native coronary artery without angina pectoris: Secondary | ICD-10-CM

## 2023-03-15 DIAGNOSIS — I255 Ischemic cardiomyopathy: Secondary | ICD-10-CM

## 2023-03-15 DIAGNOSIS — I493 Ventricular premature depolarization: Secondary | ICD-10-CM | POA: Diagnosis not present

## 2023-03-15 DIAGNOSIS — R0989 Other specified symptoms and signs involving the circulatory and respiratory systems: Secondary | ICD-10-CM

## 2023-03-15 DIAGNOSIS — E785 Hyperlipidemia, unspecified: Secondary | ICD-10-CM | POA: Diagnosis present

## 2023-03-15 DIAGNOSIS — I10 Essential (primary) hypertension: Secondary | ICD-10-CM

## 2023-03-15 DIAGNOSIS — Z01810 Encounter for preprocedural cardiovascular examination: Secondary | ICD-10-CM

## 2023-03-15 LAB — COMPREHENSIVE METABOLIC PANEL
ALT: 19 U/L (ref 0–44)
AST: 22 U/L (ref 15–41)
Albumin: 3.9 g/dL (ref 3.5–5.0)
Alkaline Phosphatase: 58 U/L (ref 38–126)
Anion gap: 3 — ABNORMAL LOW (ref 5–15)
BUN: 13 mg/dL (ref 8–23)
CO2: 25 mmol/L (ref 22–32)
Calcium: 8.8 mg/dL — ABNORMAL LOW (ref 8.9–10.3)
Chloride: 107 mmol/L (ref 98–111)
Creatinine, Ser: 0.87 mg/dL (ref 0.61–1.24)
GFR, Estimated: >60 mL/min (ref >60)
Glucose, Bld: 88 mg/dL (ref 70–99)
Potassium: 3.8 mmol/L (ref 3.5–5.1)
Sodium: 135 mmol/L (ref 135–145)
Total Bilirubin: 1 mg/dL (ref 0.3–1.2)
Total Protein: 6.1 g/dL — ABNORMAL LOW (ref 6.5–8.1)

## 2023-03-15 LAB — CBC
HCT: 43.7 % (ref 39.0–52.0)
Hemoglobin: 15.2 g/dL (ref 13.0–17.0)
MCH: 33.3 pg (ref 26.0–34.0)
MCHC: 34.8 g/dL (ref 30.0–36.0)
MCV: 95.8 fL (ref 80.0–100.0)
Platelets: 136 10*3/uL — ABNORMAL LOW (ref 150–400)
RBC: 4.56 MIL/uL (ref 4.22–5.81)
RDW: 12.6 % (ref 11.5–15.5)
WBC: 10 10*3/uL (ref 4.0–10.5)
nRBC: 0 % (ref 0.0–0.2)

## 2023-03-15 LAB — MAGNESIUM: Magnesium: 1.9 mg/dL (ref 1.7–2.4)

## 2023-03-15 LAB — TSH: TSH: 0.86 u[IU]/mL (ref 0.350–4.500)

## 2023-03-15 MED ORDER — ROSUVASTATIN CALCIUM 20 MG PO TABS: 20.0000 mg | ORAL_TABLET | Freq: Every day | ORAL | 3 refills | Status: AC

## 2023-03-15 NOTE — Patient Instructions (Signed)
Medication Instructions:   Your physician has recommended you make the following change in your medication:   Decrease Crestor to 20 mg Daily   *If you need a refill on your cardiac medications before your next appointment, please call your pharmacy*   Lab Work: Your physician recommends that you return for lab work in: Today ( CMET, Magnesium, CBC TSH)  Your physician recommends that you return for lab work in: 3 Months ( CMET, Lipid)   If you have labs (blood work) drawn today and your tests are completely normal, you will receive your results only by: MyChart Message (if you have MyChart) OR A paper copy in the mail If you have any lab test that is abnormal or we need to change your treatment, we will call you to review the results.   Testing/Procedures: Your physician has requested that you have an echocardiogram. Echocardiography is a painless test that uses sound waves to create images of your heart. It provides your doctor with information about the size and shape of your heart and how well your heart's chambers and valves are working. This procedure takes approximately one hour. There are no restrictions for this procedure. Please do NOT wear cologne, perfume, aftershave, or lotions (deodorant is allowed). Please arrive 15 minutes prior to your appointment time.  Your physician has requested that you have a carotid duplex. This test is an ultrasound of the carotid arteries in your neck. It looks at blood flow through these arteries that supply the brain with blood. Allow one hour for this exam. There are no restrictions or special instructions.  ZIO XT- Long Term Monitor Instructions   Your physician has requested you wear your ZIO patch monitor_______days.   This is a single patch monitor.  [employer] supplies one patch monitor per enrollment.  Additional stickers are not available.   Please do not apply patch if you will be having a Nuclear Stress Test, Echocardiogram, Cardiac  CT, MRI, or Chest Xray during the time frame you would be wearing the monitor. The patch cannot be worn during these tests.  You cannot remove and re-apply the ZIO XT patch monitor.   Your ZIO patch monitor will be sent USPS Priority mail from [facility] directly to your home address. The monitor may also be mailed to a PO BOX if home delivery is not available.   It may take 3-5 days to receive your monitor after you have been enrolled.   Once you have received you monitor, please review enclosed instructions.  Your monitor has already been registered assigning a specific monitor serial # to you.   Applying the monitor   Shave hair from upper left chest.   Hold abrader disc by orange tab.  Rub abrader in 40 strokes over left upper chest as indicated in your monitor instructions.   Clean area with 4 enclosed alcohol pads .  Use all pads to assure are is cleaned thoroughly.  Let dry.   Apply patch as indicated in monitor instructions.  Patch will be place under collarbone on left side of chest with arrow pointing upward.   Rub patch adhesive wings for 2 minutes.Remove white label marked "1".  Remove white label marked "2".  Rub patch adhesive wings for 2 additional minutes.   While looking in a mirror, press and release button in center of patch.  A small green light will flash 3-4 times .  This will be your only indicator the monitor has been turned on.  Do not shower for the first 24 hours.  You may shower after the first 24 hours.   Press button if you feel a symptom. You will hear a small click.  Record Date, Time and Symptom in the Patient Log Book.   When you are ready to remove patch, follow instructions on last 2 pages of Patient Log Book.  Stick patch monitor onto last page of Patient Log Book.   Place Patient Log Book in [place] box.  Use locking tab on box and tape box closed securely.  The [employer] has [employer] on it.  Please place in mailbox as soon  as possible.  Your physician should have your test results approximately 7 days after the monitor has been mailed back to [place].   Call [facility] Customer Care at [phone] if you have questions regarding your ZIO XT patch monitor.  Call them immediately if you see an orange light blinking on your monitor.   If your monitor falls off in less than 4 days contact our Monitor department at [phone].  If your monitor becomes loose or falls off after 4 days call [employer] at [phone] for suggestions on securing your monitor.     Follow-Up: At [facility], you and your health needs are our priority.  As part of our continuing mission to provide you with exceptional heart care, we have created designated Provider Care Teams.  These Care Teams include your primary Cardiologist (physician) and Advanced Practice Providers (APPs -  Physician Assistants and Nurse Practitioners) who all work together to provide you with the care you need, when you need it.  We recommend signing up for the patient portal called "MyChart".  Sign up information is provided on this After Visit Summary.  MyChart is used to connect with patients for Virtual Visits (Telemedicine).  Patients are able to view lab/test results, encounter notes, upcoming appointments, etc.  Non-urgent messages can be sent to your provider as well.   To learn more about what you can do with MyChart, go to [email or web address].    Your next appointment:   3 month(s)  Provider:   Ronie Spies, PA      Other Instructions Thank you for choosing [facility]!

## 2023-03-16 DIAGNOSIS — Z79899 Other long term (current) drug therapy: Secondary | ICD-10-CM

## 2023-03-16 MED ORDER — MAGNESIUM OXIDE 400 MG PO TABS: 400.0000 mg | ORAL_TABLET | Freq: Every day | ORAL | 3 refills | Status: DC

## 2023-03-16 MED ORDER — POTASSIUM CHLORIDE CRYS ER 20 MEQ PO TBCR: 20.0000 meq | EXTENDED_RELEASE_TABLET | Freq: Every day | ORAL | 3 refills | Status: DC

## 2023-03-16 NOTE — Telephone Encounter (Signed)
-----   Message from Laurann Montana, [place] sent at 03/16/2023  7:55 AM EDT ----- Please let pt know labs overall good. Few points to note: - K 3.8, Mg 1.9. Goal K is 4.0 and Mg 2.0 for h/o PVCs. Add KCl daily and rx Mag Ox  daily with recheck BMET/Mg in 1 week. Make sure to get labs several hours after taking the supplements that day, non-fasting. Please increase dietary intake of healthy sources of potassium/magnesium including bananas, leafy greens, nuts, seeds, fish, squash, yogurt, white beans, sweet potatoes, leafy greens, and avocados if not allergic to these things. - platelet count just mildly decreased, new. Can recheck CBC at that time. If still low anticipate f/u PCP. Otherwise continue plan as discussed.

## 2023-03-25 ENCOUNTER — Telehealth: Payer: Self-pay | Admitting: Physician Assistant

## 2023-03-25 NOTE — Telephone Encounter (Signed)
Monitor routed to Dr. Wyline Mood for interpretation. Please advise.

## 2023-03-25 NOTE — Telephone Encounter (Signed)
Pt is calling to get the results from his heart monitor that he wore. Please advise.

## 2023-03-25 NOTE — Telephone Encounter (Signed)
Patient notified via My Chart

## 2023-03-25 NOTE — Telephone Encounter (Signed)
Still has to be formally read by MD but I reviewed the preliminary study. Done to evaluate PVC burden since he had several skips in the office EKG. Overall no surprising findings. Mostly normal rhythm but 6% PVCs noted, rare PACs, 1 episode of brief fast HR from bottom of heart and 2 brief episodes from top of heart, both very short. Lowest HR recorded during sleeping hours so not worried about this.  We added potassium and magnesium supplement on 4/16 and recommended recheck labs in 1 week  with recommendation to get labs drawn a few hours after taking supplements that day. Please make sure he knows to get these drawn. Await echo as planned.

## 2023-04-01 ENCOUNTER — Other Ambulatory Visit (HOSPITAL_COMMUNITY)
Admission: RE | Admit: 2023-04-01 | Discharge: 2023-04-01 | Disposition: A | Discharge status: Home / Self Care | Payer: Commercial Managed Care - PPO | Source: Ambulatory Visit | Attending: Physician Assistant | Admitting: Physician Assistant

## 2023-04-01 ENCOUNTER — Telehealth: Payer: Self-pay

## 2023-04-01 DIAGNOSIS — Z79899 Other long term (current) drug therapy: Secondary | ICD-10-CM | POA: Insufficient documentation

## 2023-04-01 LAB — CBC
HCT: 40.9 % (ref 39.0–52.0)
Hemoglobin: 14 g/dL (ref 13.0–17.0)
MCH: 33.7 pg (ref 26.0–34.0)
MCHC: 34.2 g/dL (ref 30.0–36.0)
MCV: 98.6 fL (ref 80.0–100.0)
Platelets: 137 10*3/uL — ABNORMAL LOW (ref 150–400)
RBC: 4.15 MIL/uL — ABNORMAL LOW (ref 4.22–5.81)
RDW: 13.2 % (ref 11.5–15.5)
WBC: 7.7 10*3/uL (ref 4.0–10.5)
nRBC: 0 % (ref 0.0–0.2)

## 2023-04-01 LAB — BASIC METABOLIC PANEL
Anion gap: 8 (ref 5–15)
BUN: 10 mg/dL (ref 8–23)
CO2: 24 mmol/L (ref 22–32)
Calcium: 8.9 mg/dL (ref 8.9–10.3)
Chloride: 105 mmol/L (ref 98–111)
Creatinine, Ser: 0.95 mg/dL (ref 0.61–1.24)
GFR, Estimated: >60 mL/min (ref >60)
Glucose, Bld: 157 mg/dL — ABNORMAL HIGH (ref 70–99)
Potassium: 3.8 mmol/L (ref 3.5–5.1)
Sodium: 137 mmol/L (ref 135–145)

## 2023-04-01 LAB — MAGNESIUM: Magnesium: 1.8 mg/dL (ref 1.7–2.4)

## 2023-04-01 MED ORDER — POTASSIUM CHLORIDE CRYS ER 20 MEQ PO TBCR: 20.0000 meq | EXTENDED_RELEASE_TABLET | Freq: Two times a day (BID) | ORAL | 3 refills | Status: DC

## 2023-04-01 MED ORDER — MAGNESIUM OXIDE -MG SUPPLEMENT 400 (240 MG) MG PO TABS: 400.0000 mg | ORAL_TABLET | Freq: Two times a day (BID) | ORAL | 3 refills | Status: DC

## 2023-04-01 NOTE — Telephone Encounter (Signed)
Patient confirmed that he is taking both potassium 20 mg qd and mag oxide 400 mg qd. He eats candy for breakfast-had a [employer]ese[employer] peanut butter cup for breakfast. We talked about better breakfast choices !  He will increase potassium to 20 mg bid, and magnesium oxide 400 mg bid and repeat bmet/magnesium on 04/08/23   Labs copied to pcp

## 2023-04-01 NOTE — Telephone Encounter (Signed)
-----   Message from Laurann Montana, [place] sent at 04/01/2023 11:10 AM EDT ----- Electrolytes rechecked due to PVCs, added magnesium and potassium supplementation per 4/17 result note. Goal potassium 4.0 or greater, Mg 2.0 or greater.  Please let pt know his electrolytes have not changed except blood sugar is a bit high. Please make sure patient actually started the KCl daily and Mag Ox 400mg  daily recommended on 4/17. If so -> increase KCl to daily and Mag Ox to 400mg  BID. If not -> start and recheck BMET/Mg in 1 week, getting labs a few hours after AM dose that day. Platelet count remains minimally decreased, recommend f/u PCP for this, not acute. Keep plan for echo and carotid.

## 2023-04-07 ENCOUNTER — Ambulatory Visit (HOSPITAL_COMMUNITY)
Admission: RE | Admit: 2023-04-07 | Discharge: 2023-04-07 | Disposition: A | Discharge status: Home / Self Care | Payer: Commercial Managed Care - PPO | Source: Ambulatory Visit | Attending: Physician Assistant | Admitting: Physician Assistant

## 2023-04-07 DIAGNOSIS — I493 Ventricular premature depolarization: Secondary | ICD-10-CM | POA: Insufficient documentation

## 2023-04-07 DIAGNOSIS — I10 Essential (primary) hypertension: Secondary | ICD-10-CM

## 2023-04-07 DIAGNOSIS — R0989 Other specified symptoms and signs involving the circulatory and respiratory systems: Secondary | ICD-10-CM | POA: Insufficient documentation

## 2023-04-07 LAB — ECHOCARDIOGRAM COMPLETE
Area-P 1/2: 3.91 cm2
S' Lateral: 3.6 cm

## 2023-04-07 NOTE — Progress Notes (Signed)
*  PRELIMINARY RESULTS* Echocardiogram 2D Echocardiogram has been performed.  Stacey Drain 04/07/2023, 9:12 AM

## 2023-04-07 NOTE — Progress Notes (Signed)
*  PRELIMINARY RESULTS* Echocardiogram 2D Echocardiogram has been performed.  Geneal Huebert J 04/07/2023, 9:12 AM 

## 2023-04-08 ENCOUNTER — Other Ambulatory Visit (HOSPITAL_COMMUNITY)
Admission: RE | Admit: 2023-04-08 | Discharge: 2023-04-08 | Disposition: A | Discharge status: Home / Self Care | Payer: No Typology Code available for payment source | Source: Ambulatory Visit | Attending: Physician Assistant | Admitting: Physician Assistant

## 2023-04-08 DIAGNOSIS — Z79899 Other long term (current) drug therapy: Secondary | ICD-10-CM | POA: Insufficient documentation

## 2023-04-08 LAB — BASIC METABOLIC PANEL
Anion gap: 8 (ref 5–15)
BUN: 11 mg/dL (ref 8–23)
CO2: 24 mmol/L (ref 22–32)
Calcium: 9.3 mg/dL (ref 8.9–10.3)
Chloride: 105 mmol/L (ref 98–111)
Creatinine, Ser: 0.96 mg/dL (ref 0.61–1.24)
GFR, Estimated: >60 mL/min (ref >60)
Glucose, Bld: 104 mg/dL — ABNORMAL HIGH (ref 70–99)
Potassium: 4 mmol/L (ref 3.5–5.1)
Sodium: 137 mmol/L (ref 135–145)

## 2023-04-08 LAB — MAGNESIUM: Magnesium: 2 mg/dL (ref 1.7–2.4)

## 2023-04-10 ENCOUNTER — Telehealth: Payer: Self-pay | Admitting: Physician Assistant

## 2023-04-10 NOTE — Telephone Encounter (Signed)
   Wanted to consolidate inbox results to one singular phone call. Please let patient know overall reassuring news:   - Carotid duplex showed mild-moderate plaque, no significant narrowing of artery, good news, nothing acutely to do other than continue to make sure risk factors are controlled. At recent OV he wanted to decrease statin dose due to leg weakness, please find out if this improved. If so, keep plan for recheck CMET/fasting lipid in 05/2023. - 2D echo reassuring. Heart function is normal range at 55-60%. Per reader of echo, images reviewed side by side and the small specific area of his heart that does not move as well was also seen in 2022 so stable. There is an area of calcified muscle fiber towards bottom of heart but no blood clots in the heart. Overall no dramatic concerning new findings. - BMET, Mg look great with electrolytes. Blood sugar only a few pts above normal  Since EF was normal in setting of 6% PVC burden (not excessive) and he's been feeling well, no additional medicine changes are indicated at this time. He recently inquired about having prostate surgery. We did not get a formal request for this but I told him we'd see what studies show. From my standpoint, can proceed with surgery if needed. Just needs to let us know if any new symptoms arise in the meantime. Dr. Wyline Mood previously gave OK to hold ASA for unrelated procedure in 2023 so given no interim changes since then, OK to hold for up to 7 days if surgeon feels this needs to be held for procedure. If not requested to be held, would continue perioperatively from our standpoint if felt safe by requesting surgeon. Otherwise keep f/u as planned.  Laurann Montana, PA-C

## 2023-04-11 NOTE — Telephone Encounter (Signed)
Patient notified and verbalized understanding. Patient had no questions or concerns at this time. Pt requested copies of results be sent to Isabelle Course, PA-C at the [facility]

## 2023-04-14 ENCOUNTER — Other Ambulatory Visit (HOSPITAL_COMMUNITY): Payer: Self-pay | Admitting: Physician Assistant

## 2023-04-14 DIAGNOSIS — I25119 Atherosclerotic heart disease of native coronary artery with unspecified angina pectoris: Secondary | ICD-10-CM

## 2023-06-13 ENCOUNTER — Encounter (HOSPITAL_COMMUNITY): Payer: Self-pay

## 2023-06-13 ENCOUNTER — Ambulatory Visit (HOSPITAL_COMMUNITY): Admission: RE | Admit: 2023-06-13 | Payer: No Typology Code available for payment source | Source: Ambulatory Visit

## 2023-06-15 NOTE — Progress Notes (Signed)
Cardiology Office Note    Date:  06/16/2023  ID:  [name], DOB September 20, 1951, MRN [id number] Cardiologist: Dina Rich, MD    History of Present Illness:    Scott Castaneda is a 72 y.o. male with past medical history of CAD (s/p STEMI in 2008 with DES to RCA, NST in 03/2021 showing no ischemia), history of ischemic cardiomyopathy with normalization of EF by repeat echocardiogram imaging, PVC's, HTN, HLD and COPD who presents to the office today for 31-month follow-up.   He was examined by Ronie Spies, PA in 02/2023 and denied any specific anginal symptoms but did report generalized fatigue and leg weakness. He felt that his statin could be contributing, therefore he was encouraged to reduce Crestor to 20 mg daily to see if symptoms improved. A follow-up echocardiogram and Zio patch were also recommended to reassess his PVC burden. His echocardiogram showed a preserved EF of 55 to 60% with wall motion abnormalities noted which was similar to prior imaging. RV function was normal and he only had trivial MR. His event monitor showed predominantly normal sinus rhythm with 2 runs of SVT with the longest being 5 beats and also frequent PVC's with an overall 6% burden. Was also found to have hypokalemia and hypomagnesia with supplementation being initiated. He was cleared to proceed with planned prostate surgery at the [place].  In talking with the patient today, he reports his energy level and fatigue have significantly improved since his last office visit. He is unsure if this is due to reducing his statin or being started on potassium and magnesium supplementation. He denies any recent chest pain or dyspnea on exertion. No specific orthopnea, PND or pitting edema. He is unaware of his extra beats and denies any associated palpitations with this. Exercise is limited due to back pain but he remains active in doing yard work and chores around his home. He does continue to smoke but has reduced his tobacco  use to less than 0.5 ppd.  Was previously using Chantix with success and plans to review this again with the [place].  Studies Reviewed:   EKG: EKG is not ordered today. EKG from 03/15/2023 is reviewed and shows sinus bradycardia, HR 55 with PVC's and anterior infarct pattern.    NST: 03/2021 Blood pressure demonstrated a normal response to exercise. There was no ST segment deviation noted during stress. PVCs at rest resolved with exercise, reoccurred during recovery Findings consistent with prior inferior/inferoseptal myocardial infarction. This is an intermediate risk study based on decreased LVEF. Consider correlating LVEF with echo. There is no current myocardium at jeopardy. Duke treadmill score of 7 would support low risk for major cardiac events. The left ventricular ejection fraction is moderately decreased (41%).  Event Monitor: 02/2023   4 day monitor   Rare supraventricular ectopy in the form of isolated PACs. Two runs of SVT longest 5 beats   Frequent ventricular in the form of PVCs (6% burden). Rare couplets, triplets. Isolated 4 beat run of NSVT   No symptoms reported   Echocardiogram: 03/2023 MPRESSIONS     1. Left ventricular ejection fraction, by estimation, is 55 to 60%. The  left ventricle has normal function. The left ventricle demonstrates  regional wall motion abnormalities (see scoring diagram/findings for  description). Left ventricular diastolic  parameters are consistent with Grade I diastolic dysfunction (impaired  relaxation). Calcified trabeculation noted near LV apex, no definite  thrombus.   2. Right ventricular systolic function is normal. The right ventricular  size is normal. Tricuspid regurgitation signal is inadequate for assessing  PA pressure.   3. The mitral valve is grossly normal. Trivial mitral valve  regurgitation.   4. The aortic valve is tricuspid. Aortic valve regurgitation is not  visualized. Aortic valve sclerosis is present, with no  evidence of aortic  valve stenosis.   5. The inferior vena cava is normal in size with greater than 50%  respiratory variability, suggesting right atrial pressure of 3 mmHg.   Comparison(s): Prior images reviewed side by side. LVEF 55-60% range with  apical inferoseptal wall motion abnormality also present on last study  from 2022 per my review of images.   Carotid Dopplers: 03/2023 IMPRESSION: Minimal to moderate amount of bilateral atherosclerotic plaque, left greater than right, not resulting in a hemodynamically significant stenosis within either internal carotid artery.  Physical Exam:   VS:  BP 124/68   Pulse (!) 48   Ht 5\' 6"  (1.676 m)   Wt 160 lb 9.6 oz (72.8 kg)   SpO2 98%   BMI 25.92 kg/m    Wt Readings from Last 3 Encounters:  06/16/23 160 lb 9.6 oz (72.8 kg)  03/15/23 159 lb 12.8 oz (72.5 kg)  01/08/22 153 lb 9.6 oz (69.7 kg)     GEN: Well nourished, well developed male appearing in no acute distress NECK: No JVD; left carotid bruit.  CARDIAC: RRR, no murmurs, rubs, gallops RESPIRATORY:  Clear to auscultation without rales, wheezing or rhonchi  ABDOMEN: Appears non-distended. No obvious abdominal masses. EXTREMITIES: No clubbing or cyanosis. No pitting edema.  Distal pedal pulses are 2+ bilaterally.   Assessment and Plan:   1. CAD - He is s/p STEMI in 2008 with DES to RCA and NST in 03/2021 showed no ischemia. He denies any recent chest pain or dyspnea on exertion. Was previously cleared to proceed with planned prostate surgery and can hold ASA for 7 days if needed. - Continue current medical therapy with ASA 81 mg daily, Toprol-XL 12.5 mg daily and Crestor 20 mg daily.  His PCP at the [place] did order a coronary calcium score for screening and he reports this was not covered through community care. I reviewed with the patient today that this would likely not be necessary given that he already has a known history of coronary artery disease. We reviewed that if he  develops symptoms, a repeat NST or cardiac PET would likely be more beneficial given prior stent placement. Will route today's note to his PCP.   2. PVC's - Most recent monitor showed a 6% PVC burden and given that he had hypokalemia and hypomagnesia by labs, supplementation was initiated. Most recent labs on 04/08/2023 showed K+ was at 4.0 and magnesium 2.0. Will continue with supplementation at this time and recheck in 6-8 weeks. Continue Toprol-XL 12.5 mg daily. His bradycardia on examination today is likely due to PVC's and would not further titrate Toprol-XL given his average heart rate of 59 bpm by recent monitor.   3. History of Ischemic Cardiomyopathy - Now resolved. EF was at 55 to 60% in 03/2023. He appears euvolemic by examination today. Continue current medical therapy with Enalapril 5 mg daily and Toprol-XL 12.5 mg daily.  4. HTN - BP is well-controlled at 124/68 during today's visit. Continue current medical therapy with Enalapril 5 mg daily and Toprol-XL 12.5 mg daily.  5. HLD - LDL was at 39 in 12/2022. Continue current medical therapy with Crestor 20 mg daily. Will recheck an FLP in  2 months given recent dose reduction.  6. Tobacco Use - Still smoking ~ 0.5 ppd. He was [facility]   7. Left Carotid Bruit - Recent carotid doppler study showed minimal to moderate bilateral plaque with no significant stenosis.  Would anticipate repeat imaging in 1 to 2 years. Continue ASA and statin therapy.  Signed, Ellsworth Lennox, PA-C

## 2023-06-16 ENCOUNTER — Encounter: Payer: Self-pay | Admitting: Student

## 2023-06-16 ENCOUNTER — Ambulatory Visit: Payer: Medicare Other | Attending: Student | Admitting: Student

## 2023-06-16 VITALS — BP 124/68 | HR 48 | Ht 66.0 in | Wt 160.6 lb

## 2023-06-16 DIAGNOSIS — Z79899 Other long term (current) drug therapy: Secondary | ICD-10-CM | POA: Insufficient documentation

## 2023-06-16 DIAGNOSIS — Z8679 Personal history of other diseases of the circulatory system: Secondary | ICD-10-CM | POA: Diagnosis present

## 2023-06-16 DIAGNOSIS — I2510 Atherosclerotic heart disease of native coronary artery without angina pectoris: Secondary | ICD-10-CM | POA: Diagnosis not present

## 2023-06-16 DIAGNOSIS — Z720 Tobacco use: Secondary | ICD-10-CM | POA: Diagnosis present

## 2023-06-16 DIAGNOSIS — R0989 Other specified symptoms and signs involving the circulatory and respiratory systems: Secondary | ICD-10-CM | POA: Insufficient documentation

## 2023-06-16 DIAGNOSIS — E785 Hyperlipidemia, unspecified: Secondary | ICD-10-CM | POA: Insufficient documentation

## 2023-06-16 DIAGNOSIS — I493 Ventricular premature depolarization: Secondary | ICD-10-CM | POA: Diagnosis present

## 2023-06-16 DIAGNOSIS — I10 Essential (primary) hypertension: Secondary | ICD-10-CM | POA: Diagnosis present

## 2023-06-16 NOTE — Patient Instructions (Signed)
Medication Instructions:  Your physician recommends that you continue on your current medications as directed. Please refer to the Current Medication list given to you today.  *If you need a refill on your cardiac medications before your next appointment, please call your pharmacy*   Lab Work: Your physician recommends that you return for lab work in: 3 months ( Lipid, CMET, Magnesium)    If you have labs (blood work) drawn today and your tests are completely normal, you will receive your results only by: MyChart Message (if you have MyChart) OR A paper copy in the mail If you have any lab test that is abnormal or we need to change your treatment, we will call you to review the results.   Testing/Procedures: NONE    Follow-Up: At [facility], you and your health needs are our priority.  As part of our continuing mission to provide you with exceptional heart care, we have created designated Provider Care Teams.  These Care Teams include your primary Cardiologist (physician) and Advanced Practice Providers (APPs -  Physician Assistants and Nurse Practitioners) who all work together to provide you with the care you need, when you need it.  We recommend signing up for the patient portal called "MyChart".  Sign up information is provided on this After Visit Summary.  MyChart is used to connect with patients for Virtual Visits (Telemedicine).  Patients are able to view lab/test results, encounter notes, upcoming appointments, etc.  Non-urgent messages can be sent to your provider as well.   To learn more about what you can do with MyChart, go to [email or web address].    Your next appointment:   6 month(s)  Provider:   You may see Dina Rich, MD or one of the following Advanced Practice Providers on your designated Care Team:   Randall An, PA-C  Jacolyn Reedy, [place]     Other Instructions Thank you for choosing [facility]!

## 2023-06-28 ENCOUNTER — Ambulatory Visit (HOSPITAL_COMMUNITY)
Admission: RE | Admit: 2023-06-28 | Discharge: 2023-06-28 | Disposition: A | Discharge status: Home / Self Care | Payer: No Typology Code available for payment source | Source: Ambulatory Visit | Attending: Physician Assistant | Admitting: Physician Assistant

## 2023-06-28 DIAGNOSIS — I25119 Atherosclerotic heart disease of native coronary artery with unspecified angina pectoris: Secondary | ICD-10-CM | POA: Insufficient documentation

## 2023-09-05 ENCOUNTER — Telehealth: Payer: Self-pay | Admitting: Student

## 2023-09-05 NOTE — Telephone Encounter (Signed)
   Pre-operative Risk Assessment    Patient Name: Scott Castaneda  DOB: 09-09-1951 MRN: [id number]  Last visit 06-16-23 Next visit 12-13-23     Request for Surgical Clearance    Procedure:  Dental Extraction - Amount of Teeth to be Pulled:  16 and placement of dental implants, upper and lower  Date of Surgery:  Clearance TBD                                 Surgeon:  Madelaine Etienne DDS Surgeon's Group or Practice Name:  [facility] Phone number:  [phone] Fax number:  [phone]   Type of Clearance Requested:   - Medical  - Pharmacy:  Hold Aspirin time not indicated   Type of Anesthesia:  Local    Additional requests/questions:    SignedRoyann Shivers   09/05/2023, 4:16 PM

## 2023-09-06 ENCOUNTER — Ambulatory Visit: Payer: Medicare Other | Attending: Adult Health

## 2023-09-06 ENCOUNTER — Telehealth: Payer: Self-pay | Admitting: *Deleted

## 2023-09-06 DIAGNOSIS — Z01810 Encounter for preprocedural cardiovascular examination: Secondary | ICD-10-CM | POA: Diagnosis not present

## 2023-09-06 DIAGNOSIS — Z01818 Encounter for other preprocedural examination: Secondary | ICD-10-CM

## 2023-09-06 NOTE — Progress Notes (Signed)
Virtual Visit via Telephone Note   Because of Scott Castaneda's co-morbid illnesses, he is at least at moderate risk for complications without adequate follow up.  This format is felt to be most appropriate for this patient at this time.  The patient did not have access to video technology/had technical difficulties with video requiring transitioning to audio format only (telephone).  All issues noted in this document were discussed and addressed.  No physical exam could be performed with this format.  Please refer to the patient's chart for his consent to telehealth for [facility].  Evaluation Performed:  Preoperative cardiovascular risk assessment _____________   Date:  09/06/2023   Patient ID:  Scott Castaneda, DOB 1951-04-25, MRN [id number] Patient Location:  Home Provider location:   Office  Primary Care Provider:  Jodelle Gross, NP Primary Cardiologist:  Dina Rich, MD  Chief Complaint / Patient Profile   72 y.o. y/o male with a h/o CAD (s/p STEMI in 2008 with DES to RCA, NST in 03/2021 showing no ischemia), history of ischemic cardiomyopathy with normalization of EF by repeat echocardiogram imaging, PVC's, HTN, HLD and COPD .   He is pending Dental Extraction of 16 teeth and dentures upper and lower by Dr. Madelaine Etienne DDS, on 09/13/2023 and presents today for telephonic preoperative cardiovascular risk assessment.  History of Present Illness    Scott Castaneda is a 72 y.o. male who presents via audio/video conferencing for a telehealth visit today.  Pt was last seen in cardiology clinic on 06/16/2023 by Randall An, PA.  At that time Scott Castaneda was doing well.  The patient is now pending procedure as outlined above. Since his last visit, he he has done well from a cardiac standpoint.  He remains active, is able to work in his yard, perform his own ADLs, work around his home.  He denies any chest pain, dyspnea on exertion, palpitations, edema,  dizziness, or profound fatigue.  He has no bleeding issues on aspirin.  Past Medical History    Past Medical History:  Diagnosis Date   CAD (coronary artery disease) 2008   PCI CONE 2008   COPD (chronic obstructive pulmonary disease) (HCC)    Enlarged prostate    History of heart attack    Hyperlipidemia    Hypertension    Ischemic cardiomyopathy    45-50% by cath 2008, normalized since then   Past Surgical History:  Procedure Laterality Date   CORONARY/GRAFT ACUTE MI REVASCULARIZATION  2008   CONE   HERNIA REPAIR     LEFT GROIN    Allergies  Allergies  Allergen Reactions   Bee Venom Rash and Swelling   Bupropion Anxiety    Home Medications    Prior to Admission medications   Medication Sig Start Date End Date Taking? Authorizing Provider  albuterol (PROAIR HFA) 108 (90 Base) MCG/ACT inhaler ProAir HFA 90 mcg/actuation aerosol inhaler 04/02/20   [provider]  alfuzosin (UROXATRAL) 10 MG 24 hr tablet Take 10 mg by mouth daily with breakfast.    [provider]  aspirin 81 MG chewable tablet Chew 1 tablet by mouth daily at 6 (six) AM. 10/15/15   [provider]  Cholecalciferol (VITAMIN D) 125 MCG (5000 UT) CAPS Take 1 capsule by mouth daily at 6 (six) AM.    [provider]  enalapril (VASOTEC) 5 MG tablet Take 5 mg by mouth daily.    [provider]  fluticasone-salmeterol (ADVAIR HFA) [phone]  MCG/ACT inhaler Inhale 1 puff into the lungs 2 (two) times daily.    [provider]  Providence Lanius 500 MG CAPS Take 1 capsule by mouth daily.    [provider]  magnesium oxide (MAG-OX) 400 (240 Mg) MG tablet Take 1 tablet (400 mg total) by mouth 2 (two) times daily. 04/01/23   Dunn, Tacey Ruiz, PA-C  metoprolol succinate (TOPROL-XL) 25 MG 24 hr tablet Take 12.5 mg by mouth daily.    [provider]  naproxen sodium (ALEVE) 220 MG tablet Take 2 tablets by mouth daily as needed.    [provider]  potassium  chloride SA (KLOR-CON M) 20 MEQ tablet Take 1 tablet (20 mEq total) by mouth 2 (two) times daily. Patient taking differently: Take 20 mEq by mouth daily. 04/01/23   Dunn, Tacey Ruiz, PA-C  rosuvastatin (CRESTOR) 20 MG tablet Take 1 tablet (20 mg total) by mouth daily. 03/15/23 03/09/24  Dunn, Tacey Ruiz, PA-C  tadalafil (CIALIS) 5 MG tablet Take 5 mg by mouth daily. 01/03/22   [provider]    Physical Exam    Vital Signs:  Venita Sheffield does not have vital signs available for review today.  Given telephonic nature of communication, physical exam is limited. AAOx3. NAD. Normal affect.  Speech and respirations are unlabored.  Accessory Clinical Findings    None  Assessment & Plan    1.  Preoperative Cardiovascular Risk Assessment:  According to the Revised Cardiac Risk Index (RCRI), his Perioperative Risk of Major Cardiac Event is (%): 0.9  His Functional Capacity in METs is: 8.97 according to the Duke Activity Status Index (DASI).   The patient was advised that if he develops new symptoms prior to surgery to contact our office to arrange for a follow-up visit, and he verbalized understanding.  Per office protocol, if patient is without any new symptoms or concerns at the time of their virtual visit, he/she may hold ASA for 7 days prior to procedure. Please resume ASA as soon as possible postprocedure, at the discretion of the surgeon.    A copy of this note will be routed to requesting surgeon.    Time:   Today, I have spent 10 minutes with the patient with telehealth technology discussing medical history, symptoms, and management plan.     [name], NP  09/06/2023, 11:04 AM

## 2023-09-06 NOTE — Telephone Encounter (Signed)
Per [name], pt has been added to App Schedule for preop today, 11:00.  Consent on file / medications reconciled.

## 2023-09-06 NOTE — Telephone Encounter (Signed)
Per [name], pt has been added to App Schedule for preop today, 11:00.  Consent on file / medications reconciled.     Patient Consent for Virtual Visit        Scott Castaneda has provided verbal consent on 09/06/2023 for a virtual visit (video or telephone).   CONSENT FOR VIRTUAL VISIT FOR:  Scott Castaneda  By participating in this virtual visit I agree to the following:  I hereby voluntarily request, consent and authorize [facility] and its employed or contracted physicians, physician assistants, nurse practitioners or other licensed health care professionals (the Practitioner), to provide me with telemedicine health care services (the "Services") as deemed necessary by the treating Practitioner. I acknowledge and consent to receive the Services by the Practitioner via telemedicine. I understand that the telemedicine visit will involve communicating with the Practitioner through live audiovisual communication technology and the disclosure of certain medical information by electronic transmission. I acknowledge that I have been given the opportunity to request an in-person assessment or other available alternative prior to the telemedicine visit and am voluntarily participating in the telemedicine visit.  I understand that I have the right to withhold or withdraw my consent to the use of telemedicine in the course of my care at any time, without affecting my right to future care or treatment, and that the Practitioner or I may terminate the telemedicine visit at any time. I understand that I have the right to inspect all information obtained and/or recorded in the course of the telemedicine visit and may receive copies of available information for a reasonable fee.  I understand that some of the potential risks of receiving the Services via telemedicine include:  Delay or interruption in medical evaluation due to technological equipment failure or disruption; Information  transmitted may not be sufficient (e.g. poor resolution of images) to allow for appropriate medical decision making by the Practitioner; and/or  In rare instances, security protocols could fail, causing a breach of personal health information.  Furthermore, I acknowledge that it is my responsibility to provide information about my medical history, conditions and care that is complete and accurate to the best of my ability. I acknowledge that Practitioner's advice, recommendations, and/or decision may be based on factors not within their control, such as incomplete or inaccurate data provided by me or distortions of diagnostic images or specimens that may result from electronic transmissions. I understand that the practice of medicine is not an exact science and that Practitioner makes no warranties or guarantees regarding treatment outcomes. I acknowledge that a copy of this consent can be made available to me via my patient portal [facility] MyChart), or I can request a printed copy by calling the office of [facility].    I understand that my insurance will be billed for this visit.   I have read or had this consent read to me. I understand the contents of this consent, which adequately explains the benefits and risks of the Services being provided via telemedicine.  I have been provided ample opportunity to ask questions regarding this consent and the Services and have had my questions answered to my satisfaction. I give my informed consent for the services to be provided through the use of telemedicine in my medical care

## 2023-09-06 NOTE — Telephone Encounter (Signed)
   Name: Scott Castaneda  DOB: [birth year]  MRN: [id number]  Primary Cardiologist: Dina Rich, MD   Preoperative team, please contact this patient and set up a phone call appointment for further preoperative risk assessment. Please obtain consent and complete medication review. Thank you for your help.Last seen by Randall An, PA 06/16/2023  I confirm that guidance regarding antiplatelet and oral anticoagulation therapy has been completed and, if necessary, noted below.  Per office protocol, if patient is without any new symptoms or concerns at the time of their virtual visit, he/she may hold ASA for 7 days prior to procedure. Please resume ASA as soon as possible postprocedure, at the discretion of the surgeon.    I also confirmed the patient resides in the state of [place]. As per [facility] Medical Board telemedicine laws, the patient must reside in the state in which the provider is licensed.   [name], NP 09/06/2023, 8:01 AM [facility]

## 2023-09-06 NOTE — Telephone Encounter (Signed)
1st attempt to reach pt regarding surgical clearance and the need for a phone appointment.  Left a message for pt to call back and ask for the preop team.

## 2023-12-13 ENCOUNTER — Ambulatory Visit: Payer: No Typology Code available for payment source | Admitting: Cardiology

## 2024-03-22 ENCOUNTER — Encounter: Payer: Self-pay | Admitting: *Deleted

## 2024-04-06 ENCOUNTER — Telehealth: Payer: Self-pay | Admitting: Cardiology

## 2024-04-06 NOTE — Telephone Encounter (Signed)
 Patient called in to see if an authorization request for a procedure with Dr. Lawana Pray has been received. He says the [place] faxed it to the office. I asked if he was referring to a referral as this is needed to consult with an electrophysiology provider, but he said that it was just an authorization for a procedure but should include all the information that's needed. I informed patient I do not show any documentation of this on my end and asked if this was something he previously discussed with Dr. Satira Curet. He said he hasn't spoken with Dr. Lawana Pray. [facility] + perhaps to clarify whether EP workup is needed. Patient declined. He says whatever is needed should've already been faxed. Will be on the lookout for this--just FYI.

## 2024-04-06 NOTE — Telephone Encounter (Signed)
 FYI

## 2024-04-09 NOTE — Telephone Encounter (Signed)
 Patient calling to see if the fax was received. Advise that it wasn't. Please advise

## 2024-04-10 NOTE — Telephone Encounter (Signed)
 Pt calling to receive a update. Please advise

## 2024-04-11 NOTE — Telephone Encounter (Signed)
 Spoke with pt regarding [facility] reff[facility]. Pt was told that the [place] referral was to be faxed to us  today. Pt verbalized understanding. All questions if any were answered.

## 2024-05-02 ENCOUNTER — Ambulatory Visit: Attending: Cardiology | Admitting: Cardiology

## 2024-05-02 ENCOUNTER — Telehealth: Payer: Self-pay | Admitting: Pharmacy Technician

## 2024-05-02 ENCOUNTER — Encounter: Payer: Self-pay | Admitting: Cardiology

## 2024-05-02 VITALS — BP 134/66 | HR 54 | Ht 66.0 in | Wt 151.0 lb

## 2024-05-02 DIAGNOSIS — I493 Ventricular premature depolarization: Secondary | ICD-10-CM

## 2024-05-02 DIAGNOSIS — I5022 Chronic systolic (congestive) heart failure: Secondary | ICD-10-CM | POA: Diagnosis not present

## 2024-05-02 DIAGNOSIS — I2510 Atherosclerotic heart disease of native coronary artery without angina pectoris: Secondary | ICD-10-CM

## 2024-05-02 DIAGNOSIS — I255 Ischemic cardiomyopathy: Secondary | ICD-10-CM | POA: Diagnosis not present

## 2024-05-02 MED ORDER — MEXILETINE HCL 250 MG PO CAPS: 250.0000 mg | ORAL_CAPSULE | Freq: Two times a day (BID) | ORAL | 3 refills | Status: DC

## 2024-05-02 NOTE — Patient Instructions (Addendum)
 Medication Instructions:  Your physician has recommended you make the following change in your medication:  Mexiletine 250mg  twice daily.  Lab Work: You will need lab work within 30 day of the procedure if you would like to move forward.  You may go to any [facility] Location for your lab work:  [facility] - [address] ([facility]) - [address] [id number] [address]  [place] - [address]   [place]  - [address]   [place] - [address] - [address] [employer]  - [address] - [address] ([facility]   If you have labs (blood work) drawn today and your tests are completely normal, you will receive your results only by: [employer] (if you have MyChart)  If you have any lab test that is abnormal or we need to change your treatment, we will call you or send a MyChart message to review the results.  Testing/Procedures: PVC ablation dates:  Sept - 3, 5, 10, 11, 15, 18, 19, 23, 26   Follow-Up: At [facility], you and your health needs are our priority.  As part of our continuing mission to provide you with exceptional heart care, we have created designated Provider Care Teams.  These Care Teams include your primary Cardiologist (physician) and Advanced Practice Providers (APPs -  Physician Assistants and Nurse Practitioners) who all work together to provide you with the care you need, when you need it.  Your next appointment:   To be determined   The format for your next appointment:   In Person  Provider:   Agatha Horsfall, MD{or one of the following Advanced Practice Providers on your designated Care Team:   Mertha Abrahams, [place] [address] [place], [place] Neda Balk, NP

## 2024-05-02 NOTE — Progress Notes (Addendum)
 Electrophysiology Office Note:   Date:  05/02/2024  ID:  Scott Castaneda, DOB 25-Jun-1951, MRN [id number]  Primary Cardiologist: Armida Lander, MD Primary Heart Failure: None Electrophysiologist: None      History of Present Illness:   Scott Castaneda is a 73 y.o. male with h/o coronary disease STEMI in 2 with stenting of the RCA, ischemic cardiomyopathy with normalization of ejection fraction, PVCs, hypertension, hyperlipidemia, COPD seen today for  for Electrophysiology evaluation of PVCs at the request of Scott Castaneda.    He has a history of PVCs.  He wore a recent ZIO monitor that showed a 26% PVC burden.  He has no shortness of breath but does complain of significant fatigue.  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  He is on Eliquis and aspirin.  Prior to having an elevated burden of PVCs he felt well.  He was able to do his daily activities without restriction.  Now he has trouble doing his daily activities without having to take breaks.  Review of systems complete and found to be negative unless listed in HPI.   EP Information / Studies Reviewed:    EKG is ordered today. Personal review as below.   Risk Assessment/Calculations:          Physical Exam:   VS:  BP 134/66 (BP Location: Right Arm, Patient Position: Sitting, Cuff Size: Normal)   Pulse (!) 54   Ht 5\' 6"  (1.676 m)   Wt 151 lb (68.5 kg)   SpO2 96%   BMI 24.37 kg/m    Wt Readings from Last 3 Encounters:  05/02/24 151 lb (68.5 kg)  06/16/23 160 lb 9.6 oz (72.8 kg)  03/15/23 159 lb 12.8 oz (72.5 kg)     GEN: Well nourished, well developed in no acute distress NECK: No JVD; No carotid bruits CARDIAC: Regular rate and rhythm with occasional ectopy, no murmurs, rubs, gallops RESPIRATORY:  Clear to auscultation without rales, wheezing or rhonchi  ABDOMEN: Soft, non-tender, non-distended EXTREMITIES:  No  edema; No deformity   ASSESSMENT AND PLAN:    1.  PVCs: 26% on cardiac monitor.  Still symptomatic with fatigue and shortness of breath.  Would prefer rhythm control.  Alexica Schlossberg start mexiletine 250 mg twice daily.  He would potentially prefer this to be short-term medication and thus we Scott Castaneda plan for PVC ablation.  If he is not doing well on mexiletine, he may wish to cancel the ablation.  If he does decide to move forward with ablation, we Scott Castaneda hold both mexiletine and Eliquis for 4 days prior to the procedure.  Risk and benefits were discussed.  With no bleeding, tamponade, heart block, stroke, MI, renal failure, death.  The patient understands these risks and is agreed to the procedure.  Mexiletine likely be the best medication for the patient.  The patient is potentially planning for ablation and thus admission to the hospital for sotalol would be less beneficial.  Additionally, quinidine is a fourth line antiarrhythmic.  He has coronary calcifications, and thus class Ic antiarrhythmics would be inappropriate.  The patient is already on a beta-blocker and thus switching to alternative beta-blocker such as acebutolol would also not be appropriate.  But this medicine may be a long-term medication, amiodarone would be a less desirable option due to long-term lung liver, thyroid  side effects.  2.  Coronary disease: Post STEMI in 2008 with stenting of the RCA.  No  current chest pain.  Continue plan per primary cardiology.  3.  Chronic systolic heart failure: Due to ischemic cardiomyopathy.  Ejection fraction has normalized.  Continue management per primary cardiology.  4.  Hypertension: Well-controlled  5.  Hyperlipidemia: LDL goal less than 70.  Continue Crestor  20 mg.  6.  Tobacco abuse: Complete cessation encouraged  Follow up with Dr. Lawana Pray as usual post procedure  Signed, Scott Czerniak Cortland Ding, MD

## 2024-05-02 NOTE — Telephone Encounter (Signed)
 Pharmacy Patient Advocate Encounter   Received notification from CoverMyMeds that prior authorization for mexiletine 250mg  is required/requested.   Insurance verification completed.   The patient is insured through [facility] .   Per test claim: PA required; PA submitted to above mentioned insurance via CoverMyMeds Key/confirmation #/EOC [id number] Status is pending

## 2024-05-03 ENCOUNTER — Other Ambulatory Visit (HOSPITAL_COMMUNITY): Payer: Self-pay

## 2024-05-03 NOTE — Telephone Encounter (Signed)
   Pharmacy Patient Advocate Encounter  Received notification from [facility] that Prior Authorization for mexiletine 250mg  has been DENIED.  Full denial letter will be uploaded to the media tab. See denial reason below.

## 2024-05-08 NOTE — Telephone Encounter (Signed)
 Dr. Lawana Pray has updated his note to reflect why Mexiletine should be covered and why the other medications would not work.  Please retry for Mexiletine

## 2024-05-09 ENCOUNTER — Telehealth: Payer: Self-pay | Admitting: Pharmacy Technician

## 2024-05-09 NOTE — Telephone Encounter (Signed)
 Tried to renew pa and it wouldn't let me. Faxed for appeal to [phone]. Appeal ph: [phone]

## 2024-05-10 NOTE — Telephone Encounter (Addendum)
 Hi, I sent for the appeal with these updated notes:   However, they are still asking the following: scan is also in media

## 2024-05-10 NOTE — Telephone Encounter (Signed)
 Sent to Reece Cane, RN in the other encounter:  Hi, I sent for the appeal with these updated notes:   However, they are still asking the following:

## 2024-05-11 NOTE — Telephone Encounter (Signed)
 Received notification from [facility] that Prior Authorization for mexiletine 250mg  has been APPROVED from 05/03/24 to until further notice. Spoke to pharmacy to process.

## 2024-05-11 NOTE — Telephone Encounter (Signed)
 Pharmacy Patient Advocate Encounter Received notification from [facility] that Prior Authorization for mexiletine 250mg  has been APPROVED from 05/03/24 to until further notice. Spoke to pharmacy to process.

## 2024-05-14 NOTE — Telephone Encounter (Signed)
 Left message to call back  (aware he can also let me know via mychart).  Checking to see if pt has started Mexiletine  (need to arrange follow up).

## 2024-05-17 MED ORDER — MEXILETINE HCL 250 MG PO CAPS: 250.0000 mg | ORAL_CAPSULE | Freq: Two times a day (BID) | ORAL | 1 refills | Status: DC

## 2024-05-17 NOTE — Telephone Encounter (Addendum)
 Received a message from [place] that patient had reached out to them and very weary/resistant to starting Mexiletine after reading information on the medication.  Had a lengthy conversation with patient about the medication/concerns. He understands why it was recommended over other options.  Also aware this will a temporary medication to get him to ablation.  He understands to call  the office if any SE occur after starting. [facility] of [facility]. Patient verbalized understanding and agreeable to plan.

## 2024-05-17 NOTE — Addendum Note (Signed)
 Addended by: Alvenia Aus on: 05/17/2024 12:34 PM   Modules accepted: Orders

## 2024-05-21 ENCOUNTER — Other Ambulatory Visit (HOSPITAL_COMMUNITY): Payer: Self-pay

## 2024-06-15 NOTE — Telephone Encounter (Signed)
 Pt is calling to let Nurse Sherri know that the medication is not working like it was and he is back to feeling like he was before he started medication. Please advise

## 2024-06-21 NOTE — H&P (View-Only) (Signed)
  Electrophysiology Office Note:   Date:  07/03/2024  ID:  JOHNOTHAN BASCOMB, DOB 02-23-1951, MRN [id number]  Primary Cardiologist: Alvan Carrier, MD Primary Heart Failure: None Electrophysiologist: Will Gladis Norton, MD      History of Present Illness:   Scott Castaneda is a 73 y.o. male with h/o PVC's, CAD s/p STEMI with DES RCA, ICM with normalization of EF, HTN, HLD, tobacco abuse & COPD seen today for routine electrophysiology followup.   Since last being seen in our clinic the patient reports he was initially concerned about starting mexiletine and he ultimately started it in the first of July.  He states he does not notice a difference in his PVC's.  He reports feeling tired all the time. He states he feels better when he works.  He has been seen at [facility] & the [facility] (renal artery stenosis eval, PVC evaluation, coronary imaging).  He previously was on Toprol 25 mg daily and was instructed to go back to 12.5mg  daily for unclear reasons by the [place].  He is hesitant to consider increasing it. He reports his EF at the [place] was 40% most recently.   He  denies chest pain, palpitations, dyspnea, PND, orthopnea, nausea, vomiting, dizziness, syncope, edema, weight gain, or early satiety.   Review of systems complete and found to be negative unless listed in HPI.   EP Information / Studies Reviewed:    EKG is not ordered today. EKG from 05/02/24 reviewed which showed SB 55 bpm with frequent PVC's       Arrhythmia / AAD PVC's   Cardiac Monitor 02/2023 > PVC burden 6%   Risk Assessment/Calculations:              Physical Exam:   VS:  BP (!) 102/52 (BP Location: Left Arm, Patient Position: Sitting, Cuff Size: Normal)   Pulse (!) 53   Ht 5' 6 (1.676 m)   Wt 152 lb 9.6 oz (69.2 kg)   SpO2 98%   BMI 24.63 kg/m    Wt Readings from Last 3 Encounters:  07/03/24 152 lb 9.6 oz (69.2 kg)  05/02/24 151 lb (68.5 kg)  06/16/23 160 lb 9.6 oz (72.8 kg)     GEN: Well nourished, well developed in no  acute distress NECK: No JVD; No carotid bruits CARDIAC: Regular rate and rhythm with ectopy, no murmurs, rubs, gallops RESPIRATORY:  Clear to auscultation without rales, wheezing or rhonchi  ABDOMEN: Soft, non-tender, non-distended EXTREMITIES:  No edema; No deformity   ASSESSMENT AND PLAN:    PVC's  Reported LVEF 40% at the [place]  -continue Toprol 12.5mg  daily, pt hesitant to increase dose / BP range at home 100-120 systolic -increase mexiletine 250 mg TID   -planned for PVC ablation 08/01/2024 with Dr. 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 Norton post PVC ablation    Signed, Daphne Barrack, NP-C, AGACNP-BC [facility] - Electrophysiology  07/03/2024, 8:58 AM

## 2024-06-21 NOTE — Progress Notes (Addendum)
  Electrophysiology Office Note:   Date:  07/03/2024  ID:  JOHNOTHAN BASCOMB, DOB 02-23-1951, MRN [id number]  Primary Cardiologist: Alvan Carrier, MD Primary Heart Failure: None Electrophysiologist: Will Gladis Norton, MD      History of Present Illness:   Scott Castaneda is a 73 y.o. male with h/o PVC's, CAD s/p STEMI with DES RCA, ICM with normalization of EF, HTN, HLD, tobacco abuse & COPD seen today for routine electrophysiology followup.   Since last being seen in our clinic the patient reports he was initially concerned about starting mexiletine and he ultimately started it in the first of July.  He states he does not notice a difference in his PVC's.  He reports feeling tired all the time. He states he feels better when he works.  He has been seen at [facility] & the [facility] (renal artery stenosis eval, PVC evaluation, coronary imaging).  He previously was on Toprol 25 mg daily and was instructed to go back to 12.5mg  daily for unclear reasons by the [place].  He is hesitant to consider increasing it. He reports his EF at the [place] was 40% most recently.   He  denies chest pain, palpitations, dyspnea, PND, orthopnea, nausea, vomiting, dizziness, syncope, edema, weight gain, or early satiety.   Review of systems complete and found to be negative unless listed in HPI.   EP Information / Studies Reviewed:    EKG is not ordered today. EKG from 05/02/24 reviewed which showed SB 55 bpm with frequent PVC's       Arrhythmia / AAD PVC's   Cardiac Monitor 02/2023 > PVC burden 6%   Risk Assessment/Calculations:              Physical Exam:   VS:  BP (!) 102/52 (BP Location: Left Arm, Patient Position: Sitting, Cuff Size: Normal)   Pulse (!) 53   Ht 5' 6 (1.676 m)   Wt 152 lb 9.6 oz (69.2 kg)   SpO2 98%   BMI 24.63 kg/m    Wt Readings from Last 3 Encounters:  07/03/24 152 lb 9.6 oz (69.2 kg)  05/02/24 151 lb (68.5 kg)  06/16/23 160 lb 9.6 oz (72.8 kg)     GEN: Well nourished, well developed in no  acute distress NECK: No JVD; No carotid bruits CARDIAC: Regular rate and rhythm with ectopy, no murmurs, rubs, gallops RESPIRATORY:  Clear to auscultation without rales, wheezing or rhonchi  ABDOMEN: Soft, non-tender, non-distended EXTREMITIES:  No edema; No deformity   ASSESSMENT AND PLAN:    PVC's  Reported LVEF 40% at the [place]  -continue Toprol 12.5mg  daily, pt hesitant to increase dose / BP range at home 100-120 systolic -increase mexiletine 250 mg TID   -planned for PVC ablation 08/01/2024 with Dr. 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 Norton post PVC ablation    Signed, Daphne Barrack, NP-C, AGACNP-BC [facility] - Electrophysiology  07/03/2024, 8:58 AM

## 2024-07-03 ENCOUNTER — Encounter: Payer: Self-pay | Admitting: Pulmonary Disease

## 2024-07-03 ENCOUNTER — Ambulatory Visit: Attending: Pulmonary Disease | Admitting: Pulmonary Disease

## 2024-07-03 VITALS — BP 102/52 | HR 53 | Ht 66.0 in | Wt 152.6 lb

## 2024-07-03 DIAGNOSIS — I493 Ventricular premature depolarization: Secondary | ICD-10-CM

## 2024-07-03 DIAGNOSIS — I255 Ischemic cardiomyopathy: Secondary | ICD-10-CM

## 2024-07-03 DIAGNOSIS — I2510 Atherosclerotic heart disease of native coronary artery without angina pectoris: Secondary | ICD-10-CM

## 2024-07-03 DIAGNOSIS — I10 Essential (primary) hypertension: Secondary | ICD-10-CM

## 2024-07-03 DIAGNOSIS — I5022 Chronic systolic (congestive) heart failure: Secondary | ICD-10-CM

## 2024-07-03 MED ORDER — MEXILETINE HCL 250 MG PO CAPS: 250.0000 mg | ORAL_CAPSULE | Freq: Three times a day (TID) | ORAL | 5 refills | Status: AC

## 2024-07-03 NOTE — Addendum Note (Signed)
 Addended by: ANICETO DAPHNE CROME on: 07/03/2024 09:29 AM   Modules accepted: Orders

## 2024-07-03 NOTE — Patient Instructions (Signed)
 Medication Instructions:  Increase your Mexiletine to 250mg  three times daily > am, late afternoon and bedtime   *If you need a refill on your cardiac medications before your next appointment, please call your pharmacy*  Lab Work: No lab work today If you have labs (blood work) drawn today and your tests are completely normal, you will receive your results only by: MyChart Message (if you have MyChart) OR A paper copy in the mail If you have any lab test that is abnormal or we need to change your treatment, we will call you to review the results.  Testing/Procedures: No testing/procedures were scheduled today  Follow-Up: At [facility], you and your health needs are our priority.  As part of our continuing mission to provide you with exceptional heart care, our providers are all part of one team.  This team includes your primary Cardiologist (physician) and Advanced Practice Providers or APPs (Physician Assistants and Nurse Practitioners) who all work together to provide you with the care you need, when you need it.  Your next appointment:   Follow up post PVC ablation with Dr. Inocencio    Provider:   You may see Will Gladis Inocencio, MD or one of the following Advanced Practice Providers on your designated Care Team:    Daphne Barrack, NP    We recommend signing up for the patient portal called MyChart.  Sign up information is provided on this After Visit Summary.  MyChart is used to connect with patients for Virtual Visits (Telemedicine).  Patients are able to view lab/test results, encounter notes, upcoming appointments, etc.  Non-urgent messages can be sent to your provider as well.   To learn more about what you can do with MyChart, go to [email or web address].

## 2024-07-04 ENCOUNTER — Ambulatory Visit: Payer: Self-pay | Admitting: *Deleted

## 2024-07-04 LAB — CBC
Hematocrit: 47.4 % (ref 37.5–51.0)
Hemoglobin: 16.1 g/dL (ref 13.0–17.7)
MCH: 34.3 pg — ABNORMAL HIGH (ref 26.6–33.0)
MCHC: 34 g/dL (ref 31.5–35.7)
MCV: 101 fL — ABNORMAL HIGH (ref 79–97)
Platelets: 163 x10E3/uL (ref 150–450)
RBC: 4.7 x10E6/uL (ref 4.14–5.80)
RDW: 12 % (ref 11.6–15.4)
WBC: 10.7 x10E3/uL (ref 3.4–10.8)

## 2024-07-04 LAB — BASIC METABOLIC PANEL WITH GFR
BUN/Creatinine Ratio: 6 — AB (ref 10–24)
BUN: 7 mg/dL — AB (ref 8–27)
CO2: 23 mmol/L (ref 20–29)
Calcium: 9.4 mg/dL (ref 8.6–10.2)
Chloride: 102 mmol/L (ref 96–106)
Creatinine, Ser: 1.13 mg/dL (ref 0.76–1.27)
Glucose: 81 mg/dL (ref 70–99)
Potassium: 4 mmol/L (ref 3.5–5.2)
Sodium: 140 mmol/L (ref 134–144)
eGFR: 69 mL/min/1.73 (ref >59)

## 2024-07-26 ENCOUNTER — Telehealth: Payer: Self-pay

## 2024-07-26 NOTE — Telephone Encounter (Signed)
 Pt seen Dr. Inocencio on 6/4 and a PVC Ablation was scheduled on 9/3 at 130 pm.   The instructions I was given to tell the patient was to hold Eliquis and Mexiletine x 4 days prior to his procedure.  I messaged Dr. Inocencio to clarify these instructions and he would also like the pt to hold Metoprolol x 4 days prior to procedure. (The pt was not made aware of this on his instruction letter)  I hollace Sauer (EP RN Navigator) and she will inform the pt of this when she calls him for his pre-procedure call.

## 2024-07-26 NOTE — Telephone Encounter (Signed)
 Attempted to reach patient to discuss upcoming procedure, no answer. Left VM for patient to return call.

## 2024-07-31 NOTE — Pre-Procedure Instructions (Signed)
 Attempted to call patient regarding procedure instructions.  Left voicemail on the following items: Arrival time 1100 Nothing to eat or drink after midnight No meds AM of procedure Responsible person to drive you home and stay with you for 24 hrs  Have you missed any doses of anti-coagulant Eliquis- last dose 8/29

## 2024-08-01 ENCOUNTER — Other Ambulatory Visit: Payer: Self-pay

## 2024-08-01 ENCOUNTER — Ambulatory Visit (HOSPITAL_COMMUNITY)
Admission: RE | Disposition: A | Discharge status: Home / Self Care | Payer: Self-pay | Source: Home / Self Care | Attending: Cardiology

## 2024-08-01 ENCOUNTER — Ambulatory Visit (HOSPITAL_COMMUNITY): Admitting: Anesthesiology

## 2024-08-01 ENCOUNTER — Ambulatory Visit (HOSPITAL_COMMUNITY)
Admission: RE | Admit: 2024-08-01 | Discharge: 2024-08-01 | Disposition: A | Discharge status: Home / Self Care | Attending: Cardiology | Admitting: Cardiology

## 2024-08-01 DIAGNOSIS — Z955 Presence of coronary angioplasty implant and graft: Secondary | ICD-10-CM | POA: Insufficient documentation

## 2024-08-01 DIAGNOSIS — I252 Old myocardial infarction: Secondary | ICD-10-CM | POA: Diagnosis not present

## 2024-08-01 DIAGNOSIS — J449 Chronic obstructive pulmonary disease, unspecified: Secondary | ICD-10-CM | POA: Diagnosis not present

## 2024-08-01 DIAGNOSIS — Z79899 Other long term (current) drug therapy: Secondary | ICD-10-CM | POA: Diagnosis not present

## 2024-08-01 DIAGNOSIS — I2510 Atherosclerotic heart disease of native coronary artery without angina pectoris: Secondary | ICD-10-CM | POA: Insufficient documentation

## 2024-08-01 DIAGNOSIS — I493 Ventricular premature depolarization: Secondary | ICD-10-CM

## 2024-08-01 DIAGNOSIS — Z720 Tobacco use: Secondary | ICD-10-CM | POA: Diagnosis not present

## 2024-08-01 DIAGNOSIS — I509 Heart failure, unspecified: Secondary | ICD-10-CM

## 2024-08-01 DIAGNOSIS — I110 Hypertensive heart disease with heart failure: Secondary | ICD-10-CM | POA: Insufficient documentation

## 2024-08-01 DIAGNOSIS — E785 Hyperlipidemia, unspecified: Secondary | ICD-10-CM | POA: Diagnosis not present

## 2024-08-01 DIAGNOSIS — I5020 Unspecified systolic (congestive) heart failure: Secondary | ICD-10-CM | POA: Insufficient documentation

## 2024-08-01 DIAGNOSIS — Z539 Procedure and treatment not carried out, unspecified reason: Secondary | ICD-10-CM | POA: Insufficient documentation

## 2024-08-01 DIAGNOSIS — I255 Ischemic cardiomyopathy: Secondary | ICD-10-CM | POA: Insufficient documentation

## 2024-08-01 HISTORY — PX: PVC ABLATION: EP1236

## 2024-08-01 SURGERY — PVC ABLATION
Anesthesia: Monitor Anesthesia Care

## 2024-08-01 MED ORDER — HEPARIN (PORCINE) IN NACL 1000-0.9 UT/500ML-% IV SOLN
INTRAVENOUS | Status: DC | PRN
  Administered 2024-08-01 (×3): 500 mL

## 2024-08-01 MED ORDER — SODIUM CHLORIDE 0.9 % IV SOLN: INTRAVENOUS | Status: DC

## 2024-08-01 MED ORDER — ISOPROTERENOL HCL 0.2 MG/ML IJ SOLN
INTRAVENOUS | Status: DC | PRN
  Administered 2024-08-01: 1 ug/min via INTRAVENOUS

## 2024-08-01 MED ORDER — SODIUM CHLORIDE 0.9 % IV SOLN: INTRAVENOUS | Status: DC | PRN

## 2024-08-01 MED ORDER — BUPIVACAINE HCL (PF) 0.25 % IJ SOLN
INTRAMUSCULAR | Status: AC
  Filled 2024-08-01: qty 30

## 2024-08-01 MED ORDER — MIDAZOLAM HCL 5 MG/5ML IJ SOLN
INTRAMUSCULAR | Status: DC | PRN
  Administered 2024-08-01 (×2): 1 mg via INTRAVENOUS

## 2024-08-01 MED ORDER — MIDAZOLAM HCL 2 MG/2ML IJ SOLN
INTRAMUSCULAR | Status: AC
Start: 2024-08-01 — End: 2024-08-01
  Filled 2024-08-01: qty 2

## 2024-08-01 MED ORDER — FENTANYL CITRATE (PF) 100 MCG/2ML IJ SOLN
INTRAMUSCULAR | Status: DC | PRN
  Administered 2024-08-01 (×2): 25 ug via INTRAVENOUS

## 2024-08-01 MED ORDER — FENTANYL CITRATE (PF) 100 MCG/2ML IJ SOLN
INTRAMUSCULAR | Status: AC
  Filled 2024-08-01: qty 2

## 2024-08-01 MED ORDER — HEPARIN SODIUM (PORCINE) 1000 UNIT/ML IJ SOLN
INTRAMUSCULAR | Status: AC
  Filled 2024-08-01: qty 10

## 2024-08-01 MED ORDER — PROPOFOL 500 MG/50ML IV EMUL
INTRAVENOUS | Status: DC | PRN
  Administered 2024-08-01: 100 ug/kg/min via INTRAVENOUS

## 2024-08-01 MED ORDER — PROPOFOL 500 MG/50ML IV EMUL: INTRAVENOUS | Status: DC | PRN

## 2024-08-01 MED ORDER — BUPIVACAINE HCL (PF) 0.25 % IJ SOLN
INTRAMUSCULAR | Status: DC | PRN
  Administered 2024-08-01: 20 mL

## 2024-08-01 MED ORDER — ISOPROTERENOL HCL 0.2 MG/ML IJ SOLN
INTRAMUSCULAR | Status: AC
  Filled 2024-08-01: qty 5

## 2024-08-01 SURGICAL SUPPLY — 15 items
CATH DECANAV D CURVE (CATHETERS) IMPLANT
CATH GE 8FR SOUNDSTAR (CATHETERS) IMPLANT
CLOSURE MYNX CONTROL 6F/7F (Vascular Products) IMPLANT
CLOSURE PERCLOSE PROSTYLE (VASCULAR PRODUCTS) IMPLANT
PACK EP LF (CUSTOM PROCEDURE TRAY) ×1 IMPLANT
PAD DEFIB RADIO PHYSIO CONN (PAD) ×1 IMPLANT
PATCH CARTO3 (PAD) IMPLANT
SHEATH BAYLIS TRANSSEPTAL 98CM (NEEDLE) IMPLANT
SHEATH BRITE TIP 8FR 35CM (SHEATH) IMPLANT
SHEATH CARTO VIZIGO MED CURVE (SHEATH) IMPLANT
SHEATH PINNACLE 5F 10CM (SHEATH) IMPLANT
SHEATH PINNACLE 8F 10CM (SHEATH) IMPLANT
SHEATH PINNACLE VASC 9FR (SHEATH) IMPLANT
TUBING SMART ABLATE COOLFLOW (TUBING) IMPLANT
WIRE HI TORQ VERSACORE-J 145CM (WIRE) IMPLANT

## 2024-08-01 NOTE — Transfer of Care (Signed)
 Immediate Anesthesia Transfer of Care Note  Patient: Scott Castaneda  Procedure(s) Performed: PVC ABLATION  Patient Location: Cath Lab  Anesthesia Type:MAC  Level of Consciousness: awake, alert , oriented, and patient cooperative  Airway & Oxygen Therapy: Patient Spontanous Breathing  Post-op Assessment: Report given to RN, Post -op Vital signs reviewed and stable, Patient moving all extremities, Patient moving all extremities X 4, and Patient able to stick tongue midline  Post vital signs: Reviewed and stable  Last Vitals:  Vitals Value Taken Time  BP 153/69 08/01/24 13:51  Temp    Pulse 47 08/01/24 13:54  Resp 12 08/01/24 13:54  SpO2 96 % 08/01/24 13:54  Vitals shown include unfiled device data.  Last Pain:  Vitals:   08/01/24 1127  TempSrc: Oral  PainSc:       Patients Stated Pain Goal: 4 (08/01/24 1101)  Complications: There were no known notable events for this encounter.

## 2024-08-01 NOTE — Discharge Instructions (Addendum)
 Resume all home medications as previously prescribed.  Follow up in clinic as planned

## 2024-08-01 NOTE — Interval H&P Note (Signed)
 History and Physical Interval Note:  08/01/2024 11:28 AM  Scott Castaneda  has presented today for surgery, with the diagnosis of pvc.  The various methods of treatment have been discussed with the patient and family. After consideration of risks, benefits and other options for treatment, the patient has consented to  Procedure(s): PVC ABLATION (N/A) as a surgical intervention.  The patient's history has been reviewed, patient examined, no change in status, stable for surgery.  I have reviewed the patient's chart and labs.  Questions were answered to the patient's satisfaction.     Arielys Wandersee [employer]

## 2024-08-01 NOTE — Anesthesia Preprocedure Evaluation (Signed)
 Anesthesia Evaluation  Patient identified by MRN, date of birth, ID band Patient awake    Reviewed: Allergy & Precautions, NPO status , Patient's Chart, lab work & pertinent test results, reviewed documented beta blocker date and time   Airway Mallampati: II  TM Distance: >3 FB Neck ROM: Full    Dental  (+) Edentulous Upper, Edentulous Lower, Dental Advisory Given   Pulmonary sleep apnea and Continuous Positive Airway Pressure Ventilation , COPD,  COPD inhaler, Current SmokerPatient did not abstain from smoking.   Pulmonary exam normal breath sounds clear to auscultation       Cardiovascular hypertension, Pt. on home beta blockers and Pt. on medications + CAD, + Cardiac Stents and +CHF  Normal cardiovascular exam+ dysrhythmias (PVCs)  Rhythm:Regular Rate:Normal  TTE 2024  1. Left ventricular ejection fraction, by estimation, is 55 to 60%. The  left ventricle has normal function. The left ventricle demonstrates  regional wall motion abnormalities (see scoring diagram/findings for  description). Left ventricular diastolic  parameters are consistent with Grade I diastolic dysfunction (impaired  relaxation). Calcified trabeculation noted near LV apex, no definite  thrombus.   2. Right ventricular systolic function is normal. The right ventricular  size is normal. Tricuspid regurgitation signal is inadequate for assessing  PA pressure.   3. The mitral valve is grossly normal. Trivial mitral valve  regurgitation.   4. The aortic valve is tricuspid. Aortic valve regurgitation is not  visualized. Aortic valve sclerosis is present, with no evidence of aortic  valve stenosis.   5. The inferior vena cava is normal in size with greater than 50%  respiratory variability, suggesting right atrial pressure of 3 mmHg.     Neuro/Psych negative neurological ROS  negative psych ROS   GI/Hepatic negative GI ROS, Neg liver ROS,,,  Endo/Other   negative endocrine ROS    Renal/GU negative Renal ROS  negative genitourinary   Musculoskeletal negative musculoskeletal ROS (+)    Abdominal   Peds  Hematology negative hematology ROS (+)   Anesthesia Other Findings   Reproductive/Obstetrics                              Anesthesia Physical Anesthesia Plan  ASA: 3  Anesthesia Plan: MAC   Post-op Pain Management: Minimal or no pain anticipated   Induction: Intravenous  PONV Risk Score and Plan: Propofol  infusion, Treatment may vary due to age or medical condition, Ondansetron and Midazolam   Airway Management Planned: Natural Airway  Additional Equipment:   Intra-op Plan:   Post-operative Plan:   Informed Consent: I have reviewed the patients History and Physical, chart, labs and discussed the procedure including the risks, benefits and alternatives for the proposed anesthesia with the patient or authorized representative who has indicated his/her understanding and acceptance.     Dental advisory given  Plan Discussed with: CRNA  Anesthesia Plan Comments:         Anesthesia Quick Evaluation

## 2024-08-01 NOTE — Progress Notes (Signed)
 Bilateral groin dressings are dry/intact. No bleeding or hematoma noted. Patient fully awake. No neuro deficits noted. Will transport to short stay for discharge home after seen by Dr. Inocencio.Shona Pardo E

## 2024-08-02 ENCOUNTER — Encounter (HOSPITAL_COMMUNITY): Payer: Self-pay | Admitting: Cardiology

## 2024-08-02 MED FILL — Heparin Sodium (Porcine) Inj 1000 Unit/ML: INTRAMUSCULAR | Qty: 10 | Status: AC

## 2024-08-02 MED FILL — Midazolam HCl Inj 2 MG/2ML (Base Equivalent): INTRAMUSCULAR | Qty: 2 | Status: AC

## 2024-08-02 NOTE — Anesthesia Postprocedure Evaluation (Signed)
 Anesthesia Post Note  Patient: Scott Castaneda  Procedure(s) Performed: PVC ABLATION     Patient location during evaluation: PACU Anesthesia Type: MAC Level of consciousness: awake and alert Pain management: pain level controlled Vital Signs Assessment: post-procedure vital signs reviewed and stable Respiratory status: spontaneous breathing, nonlabored ventilation, respiratory function stable and patient connected to nasal cannula oxygen Cardiovascular status: stable and blood pressure returned to baseline Postop Assessment: no apparent nausea or vomiting Anesthetic complications: no   There were no known notable events for this encounter.  Last Vitals:  Vitals:   08/01/24 1515 08/01/24 1530  BP: 124/76 132/84  Pulse: (!) 43 (!) 32  Resp: 14 14  Temp:    SpO2: 96% 98%    Last Pain:  Vitals:   08/01/24 1457  TempSrc:   PainSc: 0-No pain                 Tamura Lasky L Ruberta Holck

## 2024-08-10 ENCOUNTER — Telehealth: Payer: Self-pay | Admitting: Cardiology

## 2024-08-10 NOTE — Telephone Encounter (Signed)
 Spoke to patient he stated his ablation was canceled last week.Stated when he was receiving  anesthesia his pvc's stopped.He stated Dr.Camnitz was suppose to be calling him back in 1 week with the next step.Advised I will send message to Dr.Camnitz's RN.

## 2024-08-10 NOTE — Telephone Encounter (Signed)
 Pt called in about ablation. He said they had to abort ablation and they were going to call with a plan of care, but he hasn't heard back yet. Please advise.

## 2024-08-10 NOTE — Telephone Encounter (Signed)
 Per note from Dr. Inocencio yesterday: Unable to perform PVC ablation as the patient was not having PVCs during the procedure.  Would start magnesium  taurate which she can get over-the-counter.  There is data that this can improve PVC burden.  Pt aware of Dr. Carolyn recommendation.  Aware I will send name of OTC supplement to him via mychart.  Advised to keep OV next month for further evaluation/discussion. Patient verbalized understanding and agreeable to plan.

## 2024-08-13 NOTE — Telephone Encounter (Signed)
 Pt called in asking what his next steps are again. He states he got message about magnesium  and he has started. He states his next appt is 10/9 but he was told Dr. Inocencio would f/u in a week.. please advise.

## 2024-08-13 NOTE — Telephone Encounter (Signed)
 Returned pt call.  He does remember our conversation last week, but still would like to know something more.  He is anxious about what the doctor [facility]) might be able to do.  Says that doctor Inocencio was going to talk with peers and let him know the following week what plan would be.  Pt does not want to wait until follow up on 10/9 to find out if there is more the doctor is able to do. Will forward to MD and let pt know once he advises. Patient verbalized understanding and agreeable to plan.

## 2024-08-15 NOTE — Telephone Encounter (Signed)
 Pt advised again of recommendation to start the Magnesium  to see if improvement in PVCs.  He will keep appt in October to discuss next steps.  He appreciates my calling him back and discussing further. He does confirm he started the Magnesium .

## 2024-09-06 ENCOUNTER — Ambulatory Visit: Admitting: Cardiology

## 2024-10-15 ENCOUNTER — Other Ambulatory Visit: Payer: Self-pay | Admitting: Neurosurgery

## 2024-10-19 ENCOUNTER — Encounter (HOSPITAL_COMMUNITY): Payer: Self-pay

## 2024-10-19 NOTE — Progress Notes (Signed)
 Surgical Instructions   Your procedure is scheduled on Wednesday October 31, 2024. Report to [facility] Main Entrance A at 6:30 A.M., then check in with the Admitting office. Any questions or running late day of surgery: call [phone]  Questions prior to your surgery date: call [phone], Monday-Friday, 8am-4pm. If you experience any cold or flu symptoms such as cough, fever, chills, shortness of breath, etc. between now and your scheduled surgery, please notify us  at the above number.     Remember:  Do not eat or drink after midnight the night before your surgery  Take these medicines the morning of surgery with A SIP OF WATER  alfuzosin (UROXATRAL)  bimatoprost (LUMIGAN) 0.03 % ophthalmic solution  fluticasone-salmeterol (ADVAIR HFA)  metoprolol succinate (TOPROL-XL)  rosuvastatin  (CRESTOR )   May take these medicines IF NEEDED: albuterol (PROAIR HFA) 108 (90 Base) MCG/ACT inhaler Please bring with you to the hospital tiZANidine (ZANAFLEX)   Follow your surgeon's instructions on when to stop your apixaban (ELIQUIS) and Asprin.  If no instructions were given by your surgeon then you will need to call the office to get those instructions.     One week prior to surgery, STOP taking any Aleve, Naproxen, Ibuprofen, Motrin, Advil, Goody's, BC's, all herbal medications, fish oil, and non-prescription vitamins. This includes your Krill Oil.                       Do NOT Smoke (Tobacco/Vaping) for 24 hours prior to your procedure.  If you use a CPAP at night, you may bring your mask/headgear for your overnight stay.   You will be asked to remove any contacts, glasses, piercing's, hearing aid's, dentures/partials prior to surgery. Please bring cases for these items if needed.    Patients discharged the day of surgery will not be allowed to drive home, and someone needs to stay with them for 24 hours.  SURGICAL WAITING ROOM VISITATION Patients may have no more than 2 support  people in the waiting area - these visitors may rotate.   Pre-op nurse will coordinate an appropriate time for 1 ADULT support person, who may not rotate, to accompany patient in pre-op.  Children under the age of 41 must have an adult with them who is not the patient and must remain in the main waiting area with an adult.  If the patient needs to stay at the hospital during part of their recovery, the visitor guidelines for inpatient rooms apply.  Please refer to the [facility] website for the visitor guidelines for any additional information.   If you received a COVID test during your pre-op visit  it is requested that you wear a mask when out in public, stay away from anyone that may not be feeling well and notify your surgeon if you develop symptoms. If you have been in contact with anyone that has tested positive in the last 10 days please notify you surgeon.      Pre-operative 4 CHG Bathing Instructions   You can play a key role in reducing the risk of infection after surgery. Your skin needs to be as free of germs as possible. You can reduce the number of germs on your skin by washing with CHG (chlorhexidine gluconate) soap before surgery. CHG is an antiseptic soap that kills germs and continues to kill germs even after washing.   DO NOT use if you have an allergy to chlorhexidine/CHG or antibacterial soaps. If your skin becomes reddened or irritated,  stop using the CHG and notify one of our RNs at [phone].   Please shower with the CHG soap starting 4 days before surgery using the following schedule:     Please keep in mind the following:  You may shave your face at any point before/day of surgery.  Place clean sheets on your bed the day you start using CHG soap. Use a clean washcloth (not used since being washed) for each shower. DO NOT sleep with pets once you start using the CHG.   CHG Shower Instructions:  Wash your face and private area with normal soap. If you choose  to wash your hair, wash first with your normal shampoo.  After you use shampoo/soap, rinse your hair and body thoroughly to remove shampoo/soap residue.  Turn the water OFF and apply  bottle of CHG soap to a CLEAN washcloth.  Apply CHG soap ONLY FROM YOUR NECK DOWN TO YOUR TOES (washing for 3-5 minutes)  DO NOT use CHG soap on face, private areas, open wounds, or sores.  Pay special attention to the area where your surgery is being performed.  If you are having back surgery, having someone wash your back for you may be helpful. Wait 2 minutes after CHG soap is applied, then you may rinse off the CHG soap.  Pat dry with a clean towel  Put on clean clothes/pajamas   If you choose to wear lotion, please use ONLY the CHG-compatible lotions that are listed below.  Additional instructions for the day of surgery:  If you choose, you may shower the morning of surgery with an antibacterial soap.  DO NOT APPLY any lotions, deodorants or cologne.   Do not bring valuables to the hospital. [facility] is not responsible for any belongings/valuables. Do not wear jewelry Put on clean/comfortable clothes.  Please brush your teeth.  Ask your nurse before applying any prescription medications to the skin.     CHG Compatible Lotions   Aveeno Moisturizing lotion  Cetaphil Moisturizing Cream  Cetaphil Moisturizing Lotion  Clairol Herbal Essence Moisturizing Lotion, Dry Skin  Clairol Herbal Essence Moisturizing Lotion, Extra Dry Skin  Clairol Herbal Essence Moisturizing Lotion, Normal Skin  Curel Age Defying Therapeutic Moisturizing Lotion with Alpha Hydroxy  Curel Extreme Care Body Lotion  Curel Soothing Hands Moisturizing Hand Lotion  Curel Therapeutic Moisturizing Cream, Fragrance-Free  Curel Therapeutic Moisturizing Lotion, Fragrance-Free  Curel Therapeutic Moisturizing Lotion, Original Formula  Eucerin Daily Replenishing Lotion  Eucerin Dry Skin Therapy Plus Alpha Hydroxy Crme  Eucerin Dry  Skin Therapy Plus Alpha Hydroxy Lotion  Eucerin Original Crme  Eucerin Original Lotion  Eucerin Plus Crme Eucerin Plus Lotion  Eucerin TriLipid Replenishing Lotion  Keri Anti-Bacterial Hand Lotion  Keri Deep Conditioning Original Lotion Dry Skin Formula Softly Scented  Keri Deep Conditioning Original Lotion, Fragrance Free Sensitive Skin Formula  Keri Lotion Fast Absorbing Fragrance Free Sensitive Skin Formula  Keri Lotion Fast Absorbing Softly Scented Dry Skin Formula  Keri Original Lotion  Keri Skin Renewal Lotion Keri Silky Smooth Lotion  Keri Silky Smooth Sensitive Skin Lotion  Nivea Body Creamy Conditioning Oil  Nivea Body Extra Enriched Lotion  Nivea Body Original Lotion  Nivea Body Sheer Moisturizing Lotion Nivea Crme  Nivea Skin Firming Lotion  NutraDerm 30 Skin Lotion  NutraDerm Skin Lotion  NutraDerm Therapeutic Skin Cream  NutraDerm Therapeutic Skin Lotion  ProShield Protective Hand Cream  Provon moisturizing lotion  Please read over the following fact sheets that you were given.

## 2024-10-19 NOTE — Telephone Encounter (Signed)
**  GENERAL/NEW REQUEST/QUESTION**  Caller Name: Dontavion  Relation to Patient:self  Callback number: [phone]  Organization (if applicable):  Pt Provider: [place]  Office Location:[facility]  Request/Question Details:  Velton Shaver 10/16/24- he is supposed to have surgery with Dr Aldo and has not heard anything- please advise

## 2024-10-22 ENCOUNTER — Encounter (HOSPITAL_COMMUNITY): Payer: Self-pay

## 2024-10-22 ENCOUNTER — Other Ambulatory Visit: Payer: Self-pay

## 2024-10-22 ENCOUNTER — Encounter (HOSPITAL_COMMUNITY)
Admission: RE | Admit: 2024-10-22 | Discharge: 2024-10-22 | Disposition: A | Discharge status: Home / Self Care | Source: Ambulatory Visit | Attending: Neurosurgery | Admitting: Neurosurgery

## 2024-10-22 VITALS — BP 126/67 | HR 61 | Temp 98.1°F | Resp 18 | Ht 66.0 in | Wt 158.9 lb

## 2024-10-22 DIAGNOSIS — I2510 Atherosclerotic heart disease of native coronary artery without angina pectoris: Secondary | ICD-10-CM | POA: Diagnosis not present

## 2024-10-22 DIAGNOSIS — J449 Chronic obstructive pulmonary disease, unspecified: Secondary | ICD-10-CM | POA: Diagnosis not present

## 2024-10-22 DIAGNOSIS — I10 Essential (primary) hypertension: Secondary | ICD-10-CM | POA: Diagnosis not present

## 2024-10-22 DIAGNOSIS — Z01818 Encounter for other preprocedural examination: Secondary | ICD-10-CM

## 2024-10-22 DIAGNOSIS — I493 Ventricular premature depolarization: Secondary | ICD-10-CM | POA: Insufficient documentation

## 2024-10-22 DIAGNOSIS — Z01812 Encounter for preprocedural laboratory examination: Secondary | ICD-10-CM | POA: Insufficient documentation

## 2024-10-22 DIAGNOSIS — I252 Old myocardial infarction: Secondary | ICD-10-CM | POA: Insufficient documentation

## 2024-10-22 DIAGNOSIS — R008 Other abnormalities of heart beat: Secondary | ICD-10-CM | POA: Insufficient documentation

## 2024-10-22 DIAGNOSIS — G4733 Obstructive sleep apnea (adult) (pediatric): Secondary | ICD-10-CM | POA: Diagnosis not present

## 2024-10-22 DIAGNOSIS — Z955 Presence of coronary angioplasty implant and graft: Secondary | ICD-10-CM | POA: Diagnosis not present

## 2024-10-22 HISTORY — DX: Presence of dental prosthetic device (complete) (partial): Z97.2

## 2024-10-22 HISTORY — DX: Acute myocardial infarction, unspecified: I21.9

## 2024-10-22 HISTORY — DX: Ventricular premature depolarization: I49.3

## 2024-10-22 HISTORY — DX: Sleep apnea, unspecified: G47.30

## 2024-10-22 LAB — BASIC METABOLIC PANEL WITH GFR
Anion gap: 8 (ref 5–15)
BUN: 9 mg/dL (ref 8–23)
CO2: 26 mmol/L (ref 22–32)
Calcium: 9.4 mg/dL (ref 8.9–10.3)
Chloride: 107 mmol/L (ref 98–111)
Creatinine, Ser: 0.95 mg/dL (ref 0.61–1.24)
GFR, Estimated: >60 mL/min (ref >60)
Glucose, Bld: 94 mg/dL (ref 70–99)
Potassium: 4.2 mmol/L (ref 3.5–5.1)
Sodium: 141 mmol/L (ref 135–145)

## 2024-10-22 LAB — CBC
HCT: 48.6 % (ref 39.0–52.0)
Hemoglobin: 16.8 g/dL (ref 13.0–17.0)
MCH: 34.4 pg — ABNORMAL HIGH (ref 26.0–34.0)
MCHC: 34.6 g/dL (ref 30.0–36.0)
MCV: 99.6 fL (ref 80.0–100.0)
Platelets: 148 K/uL — ABNORMAL LOW (ref 150–400)
RBC: 4.88 MIL/uL (ref 4.22–5.81)
RDW: 13 % (ref 11.5–15.5)
WBC: 12.1 K/uL — ABNORMAL HIGH (ref 4.0–10.5)
nRBC: 0 % (ref 0.0–0.2)

## 2024-10-22 LAB — SURGICAL PCR SCREEN
MRSA, PCR: NEGATIVE
Staphylococcus aureus: POSITIVE — AB

## 2024-10-22 NOTE — Progress Notes (Addendum)
 [facility]- pt reports that he does have clearance from Dr. Lucienne that was sent to Dr. Marda office. I spoke with Shanda from Dr. Marda office to request that this clearance be faxed to PAT testing. Pt also sees Dr. Genna for PVCs- scheduled for Ablation with MD 12/19/23 per pt report. Pt no longer sees Dr. Inocencio, and cancelled his appointment with Dr. Alvan since he sees cardiology at the [facility].   PPM/ICD - denies  Chest x-ray - N/A EKG - 08/01/24- [facility] requested.  Stress Test - 04/28/21 ECHO - 04/07/23 Cardiac Cath - 03/28/24 at [facility] in [place]- record printed in chart, but also in care everywhere  Sleep Study - Pt wears CPAP-unsure of settings    Fasting Blood Sugar - N/A  Last dose of GLP1 agonist-  N/A  Blood Thinner Instructions: Pt states that his last dose of Eliquis will be on 10/27/24- he has instructions on a paper at home from the surgeon Aspirin Instructions: Pt reports that he was not given instructions on ASA- pt instructed to call Dr. Marda office for ASA instructions.   ERAS Protcol - NPO order  COVID TEST-  N/A   Anesthesia review: yes- cardiac history- pt reports having cardiac clearance. His cardiologist at the [facility]. Pt also states that he is having a PVC ablation in January with Dr Sharron at [place]. Pt states his only symptoms from PVCs is a feeling of muscle spasm. Pt denies shortness of breath, chest pain, dizziness with PVCs. Pt states that he seldom has this symptom.   Patient denies shortness of breath, fever, cough and chest pain at PAT appointment   All instructions explained to the patient, with a verbal understanding of the material. Patient agrees to go over the instructions while at home for a better understanding. The opportunity to ask questions was provided.

## 2024-10-23 ENCOUNTER — Ambulatory Visit: Admitting: Cardiology

## 2024-10-23 NOTE — Anesthesia Preprocedure Evaluation (Signed)
 Anesthesia Evaluation    Airway        Dental   Pulmonary Current Smoker          Cardiovascular hypertension,      Neuro/Psych    GI/Hepatic   Endo/Other    Renal/GU      Musculoskeletal   Abdominal   Peds  Hematology   Anesthesia Other Findings   Reproductive/Obstetrics                              Anesthesia Physical Anesthesia Plan  ASA:   Anesthesia Plan:    Post-op Pain Management:    Induction:   PONV Risk Score and Plan:   Airway Management Planned:   Additional Equipment:   Intra-op Plan:   Post-operative Plan:   Informed Consent:   Plan Discussed with:   Anesthesia Plan Comments: (PAT note by Lynwood Hope, PA-C:  73 year old male follows cardiology for history of HTN, HLD, CAD s/p STEMI with DES to RCA 2008, ICM with normalization of EF, frequent PVCs.  Echo 03/2023 showed LVEF 55 to 60%, stable inferoseptal wall motion abnormality, grade 1 DD, normal RV, no significant valve abnormalities.  Cath 02/2024 demonstrated aneurysmal dilatation of the proximal LAD with a relatively small caliber vessel just distal to the aneurysm and sluggish flow in the proximal LAD. He was started on apixaban and low-dose aspirin. He subsequently underwent CTA at [facility] which demonstrated small aneurysm in the LAD.  He was scheduled for PVC ablation on 08/01/2024 with Dr. Inocencio.  However, PVCs terminated with sedation.  Patient was given isoproterenol  as well as IV caffeine but had no clinical PVCs.  Procedure was aborted.  He had followed up with cardiology at the [facility] and is now scheduled for repeat ablation attempt on 12/18/2024.  He does have preop clearance clearance from his [place] cardiologist Dr. Lucienne dated 10/12/2024 which states he is low risk for surgery and can hold Eliquis for 3 days prior.  Patient reports last dose of Eliquis 10/27/2024.  Other pertinent history includes current  smoker, COPD, OSA on CPAP.  Preop labs reviewed, unremarkable.  EKG 08/01/2024: Sinus bradycardia.  Rate 48.  LAD.  Inferior infarct, age-undetermined.  Anterolateral infarct, age undetermined.  Coronary CTA 05/01/2024 IMPRESSION: - Calcium  score of 402. This places the patient in the 67th percentile for  age and race based on the [facility] data base. - Normal course and trajectory of coronary arteries. Patent distal RCA stent  with mild ISR. Small aneurysmal segment of LAD in proximal segment.  Otherwise, mild-moderate, non-obstructive CAD as described. - Extracardiac findings will be interpreted and detailed in a separate  report.  Cardiac catheterization coronary angiography 03/28/2024 Left Main Normal Proximal LAD 20% Ostial Proximal LAD with a dilated, aneurysmal segment. Distal to the aneurysm, the LAD tapers to ~2 mm in caliber. Contrast injection with initial hang-up in aneurysm with subsequent clearance, consistent with sluggish flow. 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  The remainder of the LAD just distal to the aneurysm is relatively small  diameter caliber (2-2.5 mm). Results in likely sluggish flow in the proximal  LAD. Will start on therapy with DOAC in conjunction with low-dose aspirin to  minimize risk of intracoronary thrombus development. Will obtain coronary  CTA for baseline assessment. The significance of these findings in  relation to  the  patient's known high PVC burden is unclear, but I suspect is not a  contributing etiology.   Zio monitor 12/27/2023 SUMMARY: Patient had a min HR of 44 bpm, max HR of 182 bpm, and avg HR of 64 bpm. Predominant underlying rhythm was Sinus Rhythm. 23 Ventricular Tachycardia runs occurred, the run with the fastest interval lasting 4 beats with a max rate of 179 bpm, the longest lasting 8  beats with an avg rate of 103 bpm. 16 Supraventricular Tachycardia runs occurred, the run with the fastest interval lasting 5 beats with a max rate of 182 bpm, the longest lasting 11.5 secs with an avg rate of 128 bpm. Isolated SVEs were rare (<1.0%), SVE Couplets were rare (<1.0%), and SVE Triplets were rare (<1.0%). Isolated VEs were frequent (26.2%, [id number]), VE Couplets were occasional (2.2%, 14388), and VE Triplets were rare (<1.0%, 1402). Ventricular Bigeminy and Trigeminy were present.   TTE 04/07/2023:  1. Left ventricular ejection fraction, by estimation, is 55 to 60%. The  left ventricle has normal function. The left ventricle demonstrates  regional wall motion abnormalities (see scoring diagram/findings for  description). Left ventricular diastolic  parameters are consistent with Grade I diastolic dysfunction (impaired  relaxation). Calcified trabeculation noted near LV apex, no definite  thrombus.   2. Right ventricular systolic function is normal. The right ventricular  size is normal. Tricuspid regurgitation signal is inadequate for assessing  PA pressure.   3. The mitral valve is grossly normal. Trivial mitral valve  regurgitation.   4. The aortic valve is tricuspid. Aortic valve regurgitation is not  visualized. Aortic valve sclerosis is present, with no evidence of aortic  valve stenosis.   5. The inferior vena cava is normal in size with greater than 50%  respiratory variability, suggesting right atrial pressure of 3 mmHg.   Comparison(s): Prior images reviewed side by side. LVEF 55-60% range with  apical inferoseptal wall motion abnormality also present on last study  from 2022 per my review of images.    )         Anesthesia Quick Evaluation

## 2024-10-23 NOTE — Progress Notes (Signed)
 Anesthesia Chart Review:  73 year old male follows cardiology for history of HTN, HLD, CAD s/p STEMI with DES to RCA 2008, ICM with normalization of EF, frequent PVCs.  Echo 03/2023 showed LVEF 55 to 60%, stable inferoseptal wall motion abnormality, grade 1 DD, normal RV, no significant valve abnormalities.  Cath 02/2024 demonstrated aneurysmal dilatation of the proximal LAD with a relatively small caliber vessel just distal to the aneurysm and sluggish flow in the proximal LAD. He was started on apixaban and low-dose aspirin. 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  He was scheduled for PVC ablation on 08/01/2024 with Dr. Inocencio.  However, PVCs terminated with sedation.  Patient was given isoproterenol  as well as IV caffeine but had no clinical PVCs.  Procedure was aborted.  He had followed up with cardiology at the [facility] and is now scheduled for repeat ablation attempt on 12/18/2024.  He does have preop clearance clearance from his [place] cardiologist Dr. Lucienne dated 10/12/2024 which states he is low risk for surgery and can hold Eliquis for 3 days prior.  Patient reports last dose of Eliquis 10/27/2024.  Other pertinent history includes current smoker, COPD, OSA on CPAP.  Preop labs reviewed, unremarkable.  EKG 08/01/2024: Sinus bradycardia.  Rate 48.  LAD.  Inferior infarct, age-undetermined.  Anterolateral infarct, age undetermined.  Coronary CTA 05/01/2024 IMPRESSION: - Calcium  score of 402. This places the patient in the 67th percentile for  age and race based on the [facility] data base. - Normal course and trajectory of coronary arteries. Patent distal RCA stent  with mild ISR. Small aneurysmal segment of LAD in proximal segment.  Otherwise, mild-moderate, non-obstructive CAD as described. - Extracardiac findings will be interpreted and detailed in a separate  report.  Cardiac catheterization coronary angiography 03/28/2024 Left Main Normal Proximal LAD 20%  Ostial Proximal LAD with a dilated, aneurysmal segment. Distal to the aneurysm, the LAD tapers to ~2 mm in caliber. Contrast injection with initial hang-up in aneurysm with subsequent clearance, consistent with sluggish flow. 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  The remainder of the LAD just distal to the aneurysm is relatively small  diameter caliber (2-2.5 mm). Results in likely sluggish flow in the proximal  LAD. Will start on therapy with DOAC in conjunction with low-dose aspirin to  minimize risk of intracoronary thrombus development. Will obtain coronary  CTA for baseline assessment. The significance of these findings in relation to  the  patient's known high PVC burden is unclear, but I suspect is not a  contributing etiology.   Zio monitor 12/27/2023 SUMMARY: Patient had a min HR of 44 bpm, max HR of 182 bpm, and avg HR of 64 bpm. Predominant underlying rhythm was Sinus Rhythm. 23 Ventricular Tachycardia runs occurred, the run with the fastest interval lasting 4 beats with a max rate of 179 bpm, the longest lasting 8 beats with an avg rate of 103 bpm. 16 Supraventricular Tachycardia runs occurred, the run with the fastest interval lasting 5 beats with a max rate of 182 bpm, the longest lasting 11.5 secs with an avg rate of 128 bpm. Isolated SVEs were rare (<1.0%), SVE Couplets were rare (<1.0%), and SVE Triplets were rare (<1.0%). Isolated VEs were frequent (26.2%, [id number]), VE Couplets were occasional (2.2%, 14388), and VE Triplets were rare (<1.0%, 1402). Ventricular Bigeminy and Trigeminy were present.  TTE 04/07/2023:  1. Left ventricular ejection fraction, by estimation, is 55 to 60%. The  left ventricle has normal function. The left ventricle demonstrates  regional wall motion abnormalities (see scoring diagram/findings for   description). Left ventricular diastolic  parameters are consistent with Grade I diastolic dysfunction (impaired  relaxation). Calcified trabeculation noted near LV apex, no definite  thrombus.   2. Right ventricular systolic function is normal. The right ventricular  size is normal. Tricuspid regurgitation signal is inadequate for assessing  PA pressure.   3. The mitral valve is grossly normal. Trivial mitral valve  regurgitation.   4. The aortic valve is tricuspid. Aortic valve regurgitation is not  visualized. Aortic valve sclerosis is present, with no evidence of aortic  valve stenosis.   5. The inferior vena cava is normal in size with greater than 50%  respiratory variability, suggesting right atrial pressure of 3 mmHg.   Comparison(s): Prior images reviewed side by side. LVEF 55-60% range with  apical inferoseptal wall motion abnormality also present on last study  from 2022 per my review of images.      Lynwood Geofm RIGGERS [facility] Stay [facility]/Anesthesiology Phone [phone] 10/23/2024 3:13 PM

## 2025-01-28 ENCOUNTER — Encounter (HOSPITAL_COMMUNITY): Admission: RE | Payer: Self-pay | Source: Home / Self Care

## 2025-01-28 ENCOUNTER — Ambulatory Visit (HOSPITAL_COMMUNITY): Admission: RE | Admit: 2025-01-28 | Source: Home / Self Care | Admitting: Neurosurgery

## 2025-01-28 ENCOUNTER — Encounter (HOSPITAL_COMMUNITY): Payer: Self-pay | Admitting: Physician Assistant

## 2025-01-28 SURGERY — LUMBAR LAMINECTOMY/DECOMPRESSION MICRODISCECTOMY 1 LEVEL
Anesthesia: General | Site: Back | Laterality: Bilateral
